# Patient Record
Sex: Female | Born: 1990 | Race: Black or African American | Hispanic: No | Marital: Single | Smoking: Former smoker
Health system: Southern US, Community
[De-identification: ages and names within clinical notes are randomized; demographics above are authoritative.]

## PROBLEM LIST (undated history)

## (undated) ENCOUNTER — Inpatient Hospital Stay (HOSPITAL_COMMUNITY): Payer: Self-pay

## (undated) HISTORY — PX: NO PAST SURGERIES: SHX2092

---

## 2014-08-11 ENCOUNTER — Emergency Department (HOSPITAL_COMMUNITY)
Admission: EM | Admit: 2014-08-11 | Discharge: 2014-08-11 | Disposition: A | Discharge status: Home / Self Care | Payer: Self-pay | Attending: Emergency Medicine | Admitting: Emergency Medicine

## 2014-08-11 ENCOUNTER — Encounter (HOSPITAL_COMMUNITY): Payer: Self-pay | Admitting: *Deleted

## 2014-08-11 ENCOUNTER — Emergency Department (HOSPITAL_COMMUNITY): Payer: Self-pay

## 2014-08-11 DIAGNOSIS — M791 Myalgia: Secondary | ICD-10-CM | POA: Insufficient documentation

## 2014-08-11 DIAGNOSIS — R059 Cough, unspecified: Secondary | ICD-10-CM

## 2014-08-11 DIAGNOSIS — R05 Cough: Secondary | ICD-10-CM

## 2014-08-11 DIAGNOSIS — J069 Acute upper respiratory infection, unspecified: Secondary | ICD-10-CM | POA: Insufficient documentation

## 2014-08-11 LAB — COMPREHENSIVE METABOLIC PANEL
ALK PHOS: 65 U/L (ref 39–117)
ALT: 28 U/L (ref 0–35)
AST: 30 U/L (ref 0–37)
Albumin: 3.3 g/dL — ABNORMAL LOW (ref 3.5–5.2)
Anion gap: 17 — ABNORMAL HIGH (ref 5–15)
BUN: 9 mg/dL (ref 6–23)
CO2: 17 mmol/L — AB (ref 19–32)
Calcium: 8.8 mg/dL (ref 8.4–10.5)
Chloride: 100 mEq/L (ref 96–112)
Creatinine, Ser: 1.06 mg/dL (ref 0.50–1.10)
GFR calc non Af Amer: 73 mL/min — ABNORMAL LOW (ref >90)
GFR, EST AFRICAN AMERICAN: 85 mL/min — AB (ref >90)
Glucose, Bld: 93 mg/dL (ref 70–99)
Potassium: 3.3 mmol/L — ABNORMAL LOW (ref 3.5–5.1)
Sodium: 134 mmol/L — ABNORMAL LOW (ref 135–145)
Total Bilirubin: 0.7 mg/dL (ref 0.3–1.2)
Total Protein: 7.3 g/dL (ref 6.0–8.3)

## 2014-08-11 LAB — CBC WITH DIFFERENTIAL/PLATELET
BASOS PCT: 0 % (ref 0–1)
Basophils Absolute: 0 10*3/uL (ref 0.0–0.1)
EOS ABS: 0 10*3/uL (ref 0.0–0.7)
Eosinophils Relative: 0 % (ref 0–5)
HCT: 33.5 % — ABNORMAL LOW (ref 36.0–46.0)
Hemoglobin: 11.5 g/dL — ABNORMAL LOW (ref 12.0–15.0)
Lymphocytes Relative: 13 % (ref 12–46)
Lymphs Abs: 1.5 10*3/uL (ref 0.7–4.0)
MCH: 28 pg (ref 26.0–34.0)
MCHC: 34.3 g/dL (ref 30.0–36.0)
MCV: 81.7 fL (ref 78.0–100.0)
MONOS PCT: 15 % — AB (ref 3–12)
Monocytes Absolute: 1.6 10*3/uL — ABNORMAL HIGH (ref 0.1–1.0)
NEUTROS PCT: 72 % (ref 43–77)
Neutro Abs: 7.9 10*3/uL — ABNORMAL HIGH (ref 1.7–7.7)
PLATELETS: 219 10*3/uL (ref 150–400)
RBC: 4.1 MIL/uL (ref 3.87–5.11)
RDW: 14.4 % (ref 11.5–15.5)
WBC: 11 10*3/uL — ABNORMAL HIGH (ref 4.0–10.5)

## 2014-08-11 LAB — I-STAT CG4 LACTIC ACID, ED: LACTIC ACID, VENOUS: 0.8 mmol/L (ref 0.5–2.2)

## 2014-08-11 MED ORDER — IBUPROFEN 400 MG PO TABS
600.0000 mg | ORAL_TABLET | Freq: Once | ORAL | Status: AC
  Administered 2014-08-11: 600 mg via ORAL
  Filled 2014-08-11 (×2): qty 1

## 2014-08-11 MED ORDER — ACETAMINOPHEN 325 MG PO TABS
650.0000 mg | ORAL_TABLET | Freq: Four times a day (QID) | ORAL | Status: DC | PRN
  Administered 2014-08-11: 650 mg via ORAL
  Filled 2014-08-11: qty 2

## 2014-08-11 MED ORDER — IBUPROFEN 600 MG PO TABS: 600.0000 mg | ORAL_TABLET | Freq: Four times a day (QID) | ORAL | Status: DC | PRN

## 2014-08-11 NOTE — ED Notes (Signed)
Pt in c/o cough and congestion, with body aches and chills since Monday, no distress noted

## 2014-08-11 NOTE — Discharge Instructions (Signed)
Upper Respiratory Infection, Adult An upper respiratory infection (URI) is also known as the common cold. It is often caused by a type of germ (virus). Colds are easily spread (contagious). You can pass it to others by kissing, coughing, sneezing, or drinking out of the same glass. Usually, you get better in 1 or 2 weeks.  HOME CARE   Only take medicine as told by your doctor.  Use a warm mist humidifier or breathe in steam from a hot shower.  Drink enough water and fluids to keep your pee (urine) clear or pale yellow.  Get plenty of rest.  Return to work when your temperature is back to normal or as told by your doctor. You may use a face mask and wash your hands to stop your cold from spreading. GET HELP RIGHT AWAY IF:   After the first few days, you feel you are getting worse.  You have questions about your medicine.  You have chills, shortness of breath, or brown or red spit (mucus).  You have yellow or brown snot (nasal discharge) or pain in the face, especially when you bend forward.  You have a fever, puffy (swollen) neck, pain when you swallow, or white spots in the back of your throat.  You have a bad headache, ear pain, sinus pain, or chest pain.  You have a high-pitched whistling sound when you breathe in and out (wheezing).  You have a lasting cough or cough up blood.  You have sore muscles or a stiff neck. MAKE SURE YOU:   Understand these instructions.  Will watch your condition.  Will get help right away if you are not doing well or get worse. Document Released: 01/01/2008 Document Revised: 10/07/2011 Document Reviewed: 10/20/2013 ExitCare Patient Information 2015 [employer]. This information is not intended to replace advice given to you by your health care provider. Make sure you discuss any questions you have with your health care provider.  

## 2014-08-11 NOTE — ED Provider Notes (Signed)
CSN: [id number]     Arrival date & time 08/11/14  1847 History   First MD Initiated Contact with Patient 08/11/14 2140     Chief Complaint  Patient presents with  . Cough     (Consider location/radiation/quality/duration/timing/severity/associated sxs/prior Treatment) Patient is a 24 y.o. female presenting with cough. The history is provided by the patient. No language interpreter was used.  Cough Cough characteristics:  Non-productive Severity:  Moderate Onset quality:  Gradual Duration:  3 days Timing:  Constant Progression:  Resolved Chronicity:  New Smoker: no   Context: not animal exposure, not occupational exposure, not sick contacts and not upper respiratory infection   Relieved by:  Nothing Worsened by:  Nothing tried Ineffective treatments:  None tried Associated symptoms: chills, fever and myalgias   Associated symptoms: no chest pain, no ear pain, no rash, no rhinorrhea, no shortness of breath and no sinus congestion   Fever:    Timing:  Constant   Temp source:  Subjective Myalgias:    Location:  Generalized   Quality:  Aching   Severity:  Moderate   Onset quality:  Gradual   Timing:  Constant   Progression:  Unchanged   History reviewed. No pertinent past medical history. History reviewed. No pertinent past surgical history. History reviewed. No pertinent family history. History  Substance Use Topics  . Smoking status: Never Smoker   . Smokeless tobacco: Not on file  . Alcohol Use: Not on file   OB History    No data available     Review of Systems  Constitutional: Positive for fever and chills.  HENT: Negative for ear pain and rhinorrhea.   Respiratory: Positive for cough. Negative for shortness of breath.   Cardiovascular: Negative for chest pain.  Musculoskeletal: Positive for myalgias.  Skin: Negative for rash.  All other systems reviewed and are negative.     Allergies  Review of patient's allergies indicates no known allergies.  Home  Medications   Prior to Admission medications   Medication Sig Start Date End Date Taking? Authorizing Provider  acetaminophen (TYLENOL) 325 MG tablet Take 650 mg by mouth every 6 (six) hours as needed for moderate pain.   Yes Historical Provider, MD  ibuprofen (ADVIL,MOTRIN) 600 MG tablet Take 1 tablet (600 mg total) by mouth every 6 (six) hours as needed for fever. 08/11/14   Bethann BerkshireAaron Jorian [place], MD   BP 117/67 mmHg  Pulse 79  Temp(Src) 98.3 F (36.8 C) (Oral)  Resp 20  SpO2 100%  LMP 08/04/2014 Physical Exam  Constitutional: She is oriented to person, place, and time. She appears well-developed and well-nourished. No distress.  HENT:  Head: Normocephalic and atraumatic.  Eyes: Pupils are equal, round, and reactive to light.  Neck: Normal range of motion.  Cardiovascular: Normal rate, regular rhythm, normal heart sounds and intact distal pulses.   Pulmonary/Chest: Effort normal. No respiratory distress. She has no wheezes. She exhibits no tenderness.  Abdominal: Soft. Bowel sounds are normal. She exhibits no distension. There is no tenderness. There is no rebound and no guarding.  Neurological: She is alert and oriented to person, place, and time. She has normal strength. No cranial nerve deficit or sensory deficit. She exhibits normal muscle tone. Coordination and gait normal.  Skin: Skin is warm and dry.  Nursing note and vitals reviewed.   ED Course  Procedures (including critical care time) Labs Review Labs Reviewed  CBC WITH DIFFERENTIAL - Abnormal; Notable for the following:    WBC 11.0 (*)  Hemoglobin 11.5 (*)    HCT 33.5 (*)    Neutro Abs 7.9 (*)    Monocytes Relative 15 (*)    Monocytes Absolute 1.6 (*)    All other components within normal limits  COMPREHENSIVE METABOLIC PANEL - Abnormal; Notable for the following:    Sodium 134 (*)    Potassium 3.3 (*)    CO2 17 (*)    Albumin 3.3 (*)    GFR calc non Af Amer 73 (*)    GFR calc Af Amer 85 (*)    Anion gap 17 (*)     All other components within normal limits  I-STAT CG4 LACTIC ACID, ED    Imaging Review Dg Chest 2 View  08/11/2014   CLINICAL DATA:  Cough, congestion, fever, and body aches 3 days.  EXAM: CHEST  2 VIEW  COMPARISON:  None.  FINDINGS: The heart size and mediastinal contours are within normal limits. Both lungs are clear. The visualized skeletal structures are unremarkable.  IMPRESSION: No active cardiopulmonary disease.   Electronically Signed   By: Rosalie Gums M.D.   On: 08/11/2014 20:53     EKG Interpretation None      MDM   Final diagnoses:  URI (upper respiratory infection)   24 y/o F with no PMH here with myalgias, chills, fevers, and cough for the past week.  Cough has been resolved for the past two days now and has been replaced with fevers and chills.  No dysuria, frequency, or urgency.  Doubt UTI or pyelonephritis.  No spine pain, no history of IVDA doubt epidural abscess.  No murmurs doubt endocarditis.  Patient suffering from a URI with a flu like illness.  With no history of asthma does not require treatment for flu.  Will treat symptomatically.  Patient treated with motrin and provided with a prescription for the same.  Patient discharged in a good condition with instructions to return to the ED with shortness of breath, or any other concerns.  Patient expressed understanding.  Care was discussed with my attending Dr. Preston Fleeting.     Bethann Berkshire, MD 08/11/14 2256  Dione Booze, MD 08/11/14 [phone]

## 2014-08-11 NOTE — ED Notes (Signed)
MD at bedside. 

## 2014-08-11 NOTE — ED Provider Notes (Signed)
24 year old female comes in with five-day history of cough, fever, chills, body aches. Fever was subjective. Cough is actually resolved. She came in because of ongoing chills and sweats. She did not receive an influenza vaccination this season. On exam, she is afebrile. Lungs are clear. Heart has regular rate and rhythm. Workup in the ED is unremarkable. She has a flulike illness and is advised to treated symptomatically.  I saw and evaluated the patient, reviewed the resident's note and I agree with the findings and plan.    Dione Boozeavid Aitana Burry, MD 08/11/14 2228

## 2016-07-15 LAB — OB RESULTS CONSOLE HIV ANTIBODY (ROUTINE TESTING): HIV: NONREACTIVE

## 2016-07-15 LAB — OB RESULTS CONSOLE GC/CHLAMYDIA
CHLAMYDIA, DNA PROBE: NEGATIVE
GC PROBE AMP, GENITAL: NEGATIVE

## 2016-07-15 LAB — OB RESULTS CONSOLE RPR: RPR: NONREACTIVE

## 2016-07-15 LAB — OB RESULTS CONSOLE HEPATITIS B SURFACE ANTIGEN: Hepatitis B Surface Ag: NEGATIVE

## 2016-07-15 LAB — OB RESULTS CONSOLE VARICELLA ZOSTER ANTIBODY, IGG: VARICELLA IGG: NON-IMMUNE/NOT IMMUNE

## 2016-07-15 LAB — OB RESULTS CONSOLE RUBELLA ANTIBODY, IGM: Rubella: IMMUNE

## 2016-07-29 NOTE — L&D Delivery Note (Cosign Needed)
Patient is 26 y.o. G3P1011 661w0d admitted for SOL.    Delivery Note At 2:07 AM a viable female was delivered via Vaginal, Spontaneous Delivery (Presentation: Occiput anterior ).  APGAR: 8, 9; weight pending.   Cord: 3 vessel. Cord avulsion occurred during delivery of placenta. Placenta was easily extracted with all membranes intact.   Anesthesia:  None  Lacerations: None; small left periurethral tear without bleeding  Est. Blood Loss (mL): 50  Mom to postpartum.  Baby to Couplet care / Skin to Skin.  De HollingsheadCatherine L Wallace 09/16/2016, 2:24 AM

## 2016-09-15 ENCOUNTER — Inpatient Hospital Stay (HOSPITAL_COMMUNITY)
Admission: AD | Admit: 2016-09-15 | Discharge: 2016-09-17 | DRG: 775 | Disposition: A | Discharge status: Home / Self Care | Payer: Medicaid Other | Source: Ambulatory Visit | Attending: Obstetrics & Gynecology | Admitting: Obstetrics & Gynecology

## 2016-09-15 ENCOUNTER — Encounter (HOSPITAL_COMMUNITY): Payer: Self-pay

## 2016-09-15 DIAGNOSIS — Z141 Cystic fibrosis carrier: Secondary | ICD-10-CM

## 2016-09-15 DIAGNOSIS — Z3A38 38 weeks gestation of pregnancy: Secondary | ICD-10-CM

## 2016-09-15 DIAGNOSIS — O99324 Drug use complicating childbirth: Principal | ICD-10-CM | POA: Diagnosis present

## 2016-09-15 DIAGNOSIS — F1290 Cannabis use, unspecified, uncomplicated: Secondary | ICD-10-CM | POA: Diagnosis present

## 2016-09-16 ENCOUNTER — Encounter (HOSPITAL_COMMUNITY): Payer: Self-pay | Admitting: Anesthesiology

## 2016-09-16 ENCOUNTER — Encounter (HOSPITAL_COMMUNITY): Payer: Self-pay | Admitting: Certified Nurse Midwife

## 2016-09-16 DIAGNOSIS — Z141 Cystic fibrosis carrier: Secondary | ICD-10-CM | POA: Diagnosis not present

## 2016-09-16 DIAGNOSIS — F1220 Cannabis dependence, uncomplicated: Secondary | ICD-10-CM | POA: Diagnosis not present

## 2016-09-16 DIAGNOSIS — O99324 Drug use complicating childbirth: Secondary | ICD-10-CM | POA: Diagnosis not present

## 2016-09-16 DIAGNOSIS — F1290 Cannabis use, unspecified, uncomplicated: Secondary | ICD-10-CM | POA: Diagnosis present

## 2016-09-16 DIAGNOSIS — Z3A38 38 weeks gestation of pregnancy: Secondary | ICD-10-CM

## 2016-09-16 DIAGNOSIS — F1210 Cannabis abuse, uncomplicated: Secondary | ICD-10-CM | POA: Diagnosis not present

## 2016-09-16 DIAGNOSIS — Z3493 Encounter for supervision of normal pregnancy, unspecified, third trimester: Secondary | ICD-10-CM | POA: Diagnosis present

## 2016-09-16 LAB — CBC
HEMATOCRIT: 32.9 % — AB (ref 36.0–46.0)
HEMOGLOBIN: 11.5 g/dL — AB (ref 12.0–15.0)
MCH: 27.1 pg (ref 26.0–34.0)
MCHC: 35 g/dL (ref 30.0–36.0)
MCV: 77.4 fL — AB (ref 78.0–100.0)
Platelets: 145 10*3/uL — ABNORMAL LOW (ref 150–400)
RBC: 4.25 MIL/uL (ref 3.87–5.11)
RDW: 14.5 % (ref 11.5–15.5)
WBC: 10 10*3/uL (ref 4.0–10.5)

## 2016-09-16 LAB — TYPE AND SCREEN
ABO/RH(D): O POS
Antibody Screen: NEGATIVE

## 2016-09-16 LAB — RPR: RPR: NONREACTIVE

## 2016-09-16 LAB — RAPID URINE DRUG SCREEN, HOSP PERFORMED
Amphetamines: NOT DETECTED
Barbiturates: NOT DETECTED
Benzodiazepines: NOT DETECTED
COCAINE: NOT DETECTED
OPIATES: NOT DETECTED
TETRAHYDROCANNABINOL: POSITIVE — AB

## 2016-09-16 LAB — ABO/RH: ABO/RH(D): O POS

## 2016-09-16 MED ORDER — LACTATED RINGERS IV SOLN
500.0000 mL | INTRAVENOUS | Status: DC | PRN
  Administered 2016-09-16: 500 mL via INTRAVENOUS

## 2016-09-16 MED ORDER — WITCH HAZEL-GLYCERIN EX PADS: 1.0000 "application " | MEDICATED_PAD | CUTANEOUS | Status: DC | PRN

## 2016-09-16 MED ORDER — ONDANSETRON HCL 4 MG/2ML IJ SOLN: 4.0000 mg | Freq: Four times a day (QID) | INTRAMUSCULAR | Status: DC | PRN

## 2016-09-16 MED ORDER — TETANUS-DIPHTH-ACELL PERTUSSIS 5-2.5-18.5 LF-MCG/0.5 IM SUSP: 0.5000 mL | Freq: Once | INTRAMUSCULAR | Status: DC

## 2016-09-16 MED ORDER — SENNOSIDES-DOCUSATE SODIUM 8.6-50 MG PO TABS
2.0000 | ORAL_TABLET | ORAL | Status: DC
  Administered 2016-09-16: 2 via ORAL
  Filled 2016-09-16: qty 2

## 2016-09-16 MED ORDER — FENTANYL CITRATE (PF) 100 MCG/2ML IJ SOLN: 50.0000 ug | INTRAMUSCULAR | Status: DC | PRN

## 2016-09-16 MED ORDER — OXYTOCIN BOLUS FROM INFUSION
500.0000 mL | Freq: Once | INTRAVENOUS | Status: AC
  Administered 2016-09-16: 500 mL via INTRAVENOUS

## 2016-09-16 MED ORDER — COCONUT OIL OIL: 1.0000 "application " | TOPICAL_OIL | Status: DC | PRN

## 2016-09-16 MED ORDER — ONDANSETRON HCL 4 MG PO TABS: 4.0000 mg | ORAL_TABLET | ORAL | Status: DC | PRN

## 2016-09-16 MED ORDER — PRENATAL MULTIVITAMIN CH
1.0000 | ORAL_TABLET | Freq: Every day | ORAL | Status: DC
  Administered 2016-09-16 – 2016-09-17 (×2): 1 via ORAL
  Filled 2016-09-16 (×2): qty 1

## 2016-09-16 MED ORDER — DIPHENHYDRAMINE HCL 25 MG PO CAPS: 25.0000 mg | ORAL_CAPSULE | Freq: Four times a day (QID) | ORAL | Status: DC | PRN

## 2016-09-16 MED ORDER — OXYCODONE HCL 5 MG PO TABS: 5.0000 mg | ORAL_TABLET | ORAL | Status: DC | PRN

## 2016-09-16 MED ORDER — ACETAMINOPHEN 325 MG PO TABS: 650.0000 mg | ORAL_TABLET | ORAL | Status: DC | PRN

## 2016-09-16 MED ORDER — OXYTOCIN 40 UNITS IN LACTATED RINGERS INFUSION - SIMPLE MED
2.5000 [IU]/h | INTRAVENOUS | Status: DC
  Filled 2016-09-16: qty 1000

## 2016-09-16 MED ORDER — SOD CITRATE-CITRIC ACID 500-334 MG/5ML PO SOLN: 30.0000 mL | ORAL | Status: DC | PRN

## 2016-09-16 MED ORDER — LIDOCAINE HCL (PF) 1 % IJ SOLN
30.0000 mL | INTRAMUSCULAR | Status: DC | PRN
  Filled 2016-09-16: qty 30

## 2016-09-16 MED ORDER — DIBUCAINE 1 % RE OINT: 1.0000 "application " | TOPICAL_OINTMENT | RECTAL | Status: DC | PRN

## 2016-09-16 MED ORDER — SIMETHICONE 80 MG PO CHEW: 80.0000 mg | CHEWABLE_TABLET | ORAL | Status: DC | PRN

## 2016-09-16 MED ORDER — OXYCODONE-ACETAMINOPHEN 5-325 MG PO TABS: 1.0000 | ORAL_TABLET | ORAL | Status: DC | PRN

## 2016-09-16 MED ORDER — OXYCODONE HCL 5 MG PO TABS: 10.0000 mg | ORAL_TABLET | ORAL | Status: DC | PRN

## 2016-09-16 MED ORDER — BENZOCAINE-MENTHOL 20-0.5 % EX AERO: 1.0000 "application " | INHALATION_SPRAY | CUTANEOUS | Status: DC | PRN

## 2016-09-16 MED ORDER — ONDANSETRON HCL 4 MG/2ML IJ SOLN: 4.0000 mg | INTRAMUSCULAR | Status: DC | PRN

## 2016-09-16 MED ORDER — IBUPROFEN 600 MG PO TABS
600.0000 mg | ORAL_TABLET | Freq: Four times a day (QID) | ORAL | Status: DC
  Administered 2016-09-16 – 2016-09-17 (×6): 600 mg via ORAL
  Filled 2016-09-16 (×6): qty 1

## 2016-09-16 MED ORDER — FLEET ENEMA 7-19 GM/118ML RE ENEM: 1.0000 | ENEMA | RECTAL | Status: DC | PRN

## 2016-09-16 MED ORDER — LACTATED RINGERS IV SOLN
INTRAVENOUS | Status: DC
  Administered 2016-09-16: 02:00:00 via INTRAVENOUS

## 2016-09-16 MED ORDER — OXYCODONE-ACETAMINOPHEN 5-325 MG PO TABS: 2.0000 | ORAL_TABLET | ORAL | Status: DC | PRN

## 2016-09-16 MED ORDER — ZOLPIDEM TARTRATE 5 MG PO TABS: 5.0000 mg | ORAL_TABLET | Freq: Every evening | ORAL | Status: DC | PRN

## 2016-09-16 NOTE — H&P (Signed)
**Note Summer-Identified via Obfuscation** LABOR ADMISSION HISTORY AND PHYSICAL  Summer Barnes is a 26 y.o. female G3P1011 with IUP at [redacted]w[redacted]d by LMP presenting for SOL. She reports +FMs, No LOF, no VB, no blurry vision, headaches or peripheral edema, and RUQ pain.  She plans on breast feeding. She request IUD for birth control.  Dating: By LMP --->  Estimated Date of Delivery: 09/30/16  Prenatal History/Complications: THC use during pregnancy  Late and Limited PNC (Established at 29w, only two PNC visits)  Cystic Fibrosis Carrier   Past Medical History: History reviewed. No pertinent past medical history.  Past Surgical History: History reviewed. No pertinent surgical history.  Obstetrical History: OB History    Gravida Para Term Preterm AB Living   3 1 1   1 1    SAB TAB Ectopic Multiple Live Births   1       1      Social History: Social History   Social History  . Marital status: Single    Spouse name: N/A  . Number of children: N/A  . Years of education: N/A   Social History Main Topics  . Smoking status: Never Smoker  . Smokeless tobacco: None  . Alcohol use None  . Drug use: Unknown  . Sexual activity: Not Asked   Other Topics Concern  . None   Social History Narrative  . None    Family History: History reviewed. No pertinent family history.  Allergies: No Known Allergies  Prescriptions Prior to Admission  Medication Sig Dispense Refill Last Dose  . Prenatal Vit-Fe Fumarate-FA (PRENATAL MULTIVITAMIN) TABS tablet Take 1 tablet by mouth daily at 12 noon.     Marland Kitchen acetaminophen (TYLENOL) 325 MG tablet Take 650 mg by mouth every 6 (six) hours as needed for moderate pain.   08/11/2014 at Unknown time  . ibuprofen (ADVIL,MOTRIN) 600 MG tablet Take 1 tablet (600 mg total) by mouth every 6 (six) hours as needed for fever. 30 tablet 0      Review of Systems   All systems reviewed and negative except as stated in HPI  Blood pressure 121/74, pulse 98, temperature 98.5 F (36.9 C), temperature source  Oral, resp. rate 16, height 5\' 5"  (1.651 m), weight 83.9 kg (185 lb). General appearance: alert, cooperative and no distress Lungs: clear to auscultation bilaterally Heart: regular rate and rhythm Abdomen: soft, non-tender; bowel sounds normal Extremities: Homans sign is negative, no sign of DVT Presentation: cephalic Fetal monitoringBaseline: 145 bpm, Variability: Good {> 6 bpm), Accelerations: Reactive and Decelerations: Absent Uterine activity Irregular Dilation: 6 Effacement (%): 90 Station: -2 Exam by:: Lanice Shirts RN    Prenatal labs: ABO, Rh:  O+ Antibody:  Neg Rubella: Immune RPR:   Negative HBsAg:   Negative  HIV:   Non-reactive GBS:   Unknown  1 hr Glucola 125 Genetic screening  Too late  Anatomy US Normal   Prenatal Transfer Tool  Maternal Diabetes: No Genetic Screening: Declined; established care too late  Maternal Ultrasounds/Referrals: Normal Fetal Ultrasounds or other Referrals:  None Maternal Substance Abuse:  Yes:  Type: Marijuana Significant Maternal Medications:  None Significant Maternal Lab Results: None  No results found for this or any previous visit (from the past 24 hour(s)).  There are no active problems to display for this patient.   Assessment/Plan: Summer Barnes is a 26 y.o. G3P1011 at [redacted]w[redacted]d here for SOL.   #Labor:Expectant management #Pain: Declines epidural.  #FWB: Cat I  #ID:  GBS unknown. Will obtain rapid GBS. No indication  for prophylaxis at present.  #MOF: Breast.  #MOC: IUD.  #Circ: N/A  #Substance Abuse during Pregnancy: UDS   Summer Barnes 09/16/2016, 12:53 AM

## 2016-09-16 NOTE — Lactation Note (Signed)
This note was copied from a baby's chart. Lactation Consultation Note Experienced BF mom BF her now 26 yr old for 4 months. Mom has everted nipples. Hand expression taught, expressed 15 ml colostrum. Supplementing sheet given and reviewed. Explained to mom BF first, then supplement. Supplement w/colostrum first, then if needed formula. Educated on colostrum storage. Mom encouraged to feed baby 8-12 times/24 hours and with feeding cues.  Mom shown how to use DEBP & how to disassemble, clean, & reassemble parts. Mom knows to pump q3h for 15-20 min. Educated newborn behavior, feeding behavior, supply and demand, STS, I&O.  WH/LC brochure given w/resources, support groups and LC services. Patient Name: Summer Alvina FilbertSomarlia Rauch ZOXWR'UToday's Date: 09/16/2016 Reason for consult: Initial assessment;Infant < 6lbs   Maternal Data Has patient been taught Hand Expression?: Yes Does the patient have breastfeeding experience prior to this delivery?: Yes  Feeding Feeding Type: Breast Fed Length of feed: 20 min  LATCH Score/Interventions Latch: Repeated attempts needed to sustain latch, nipple held in mouth throughout feeding, stimulation needed to elicit sucking reflex. (per MOB) Intervention(s): Adjust position  Audible Swallowing: A few with stimulation (per MOB) Intervention(s): Skin to skin;Hand expression  Type of Nipple: Everted at rest and after stimulation  Comfort (Breast/Nipple): Soft / non-tender     Hold (Positioning): Assistance needed to correctly position infant at breast and maintain latch. Intervention(s): Breastfeeding basics reviewed;Support Pillows;Position options;Skin to skin  LATCH Score: 7  Lactation Tools Discussed/Used Tools: Pump Breast pump type: Double-Electric Breast Pump WIC Program: Yes Pump Review: Setup, frequency, and cleaning;Milk Storage Initiated by:: Peri JeffersonL. Deshara Rossi RN IBCLC Date initiated:: 09/16/16   Consult Status Consult Status: Follow-up Date:  09/17/16 Follow-up type: In-patient    Delania Ferg, Diamond NickelLAURA G 09/16/2016, 7:07 AM

## 2016-09-16 NOTE — MAU Note (Signed)
Patient presents to MAU with complaints of contractions beginning at 2000 on 09/15/16. Denies LOF and bleeding. +FM

## 2016-09-16 NOTE — MAU Note (Signed)
Urine sent to lab 

## 2016-09-17 ENCOUNTER — Encounter (HOSPITAL_COMMUNITY): Payer: Self-pay

## 2016-09-17 ENCOUNTER — Ambulatory Visit: Payer: Self-pay

## 2016-09-17 DIAGNOSIS — F1210 Cannabis abuse, uncomplicated: Secondary | ICD-10-CM

## 2016-09-17 MED ORDER — IBUPROFEN 600 MG PO TABS: 600.0000 mg | ORAL_TABLET | Freq: Four times a day (QID) | ORAL | 0 refills | Status: DC

## 2016-09-17 NOTE — Lactation Note (Signed)
This note was copied from a baby's chart. Lactation Consultation Note  Patient Name: [name] ZOXWR'UToday's Date: 09/17/2016 Reason for consult: Follow-up assessment  Baby 40 hours old. Parents giving baby a bottle of Alimentum. Gave parents the formula guidelines and assisted with paced feeding. Parents given regular Similac Pro-Advanced to use from here forward since not putting baby to breast.   Maternal Data    Feeding Feeding Type: Formula  LATCH Score/Interventions                      Lactation Tools Discussed/Used     Consult Status Consult Status: PRN    Sherlyn HayJennifer D Quindarrius [place] 09/17/2016, 6:48 PM

## 2016-09-17 NOTE — Discharge Summary (Signed)
Obstetric Discharge Summary Reason for Admission: ROL and onset of labor at 38 weeks Prenatal Procedures: ultrasound Intrapartum Procedures: spontaneous vaginal delivery Postpartum Procedures: none Complications-Operative and Postpartum: none Hemoglobin  Date Value Ref Range Status  09/16/2016 11.5 (L) 12.0 - 15.0 g/dL Final   HCT  Date Value Ref Range Status  09/16/2016 32.9 (L) 36.0 - 46.0 % Final    Physical Exam:  General: alert Lochia: appropriate Uterine Fundus: firm DVT Evaluation: No evidence of DVT seen on physical exam.  Discharge Diagnoses: Term Pregnancy-delivered  Discharge Information: Date: 09/17/2016 Activity: pelvic rest Diet: routine Medications: PNV and Ibuprofen Condition: stable Instructions: refer to practice specific booklet Discharge to: home  She plans IUD at pp visit. [facility] Schedule an appointment as soon as possible for a visit in 6 week(s).   Why:  Tell them you want an IUD Contact information: [address] [place] [id number] [phone]           Newborn Data: Live born female  Birth Weight: 4 lb 14.6 oz (2228 g) APGAR: 8, 9  Home with mother.  Allie BossierMyra C Nickson [place] 09/17/2016, 7:55 AM

## 2016-09-17 NOTE — Discharge Instructions (Signed)

## 2016-09-17 NOTE — Lactation Note (Signed)
This note was copied from a baby's chart. Lactation Consultation Note Baby w/3% weight loss weighing 4.12 lbs. Mom is giving formula. Has given some colostrum. Baby not to interested in feeding at this time. Baby chews and tongue thrust nipple. Suckles occasionally then chews. Mom states she has a hard time getting baby to take formula. Mom states baby will take BM better.  Reviewed amount baby needs to be eating. Mom putting to breast occasionally. Baby has a tiny mouth, mom has med. Size nipples. while in rm. Mom attempted to latch baby. Baby wasn't interested.  Asked mom if she had been pumping, mom stated she did a couple of times but nothing came out. Reminded mom of the consistency of colostrum, explained breast needs to be stimulated d/t baby SGA, will not stimulate breast enough.  Reviewed how to use pump, applied to Lt. Breast, colostrum beading up to nipple right away. Mom excited.  Hand expressed 14 ml colostrum. Mom hand expressed 3 ml. Mom was giving to baby as LC left rm.  Encouraged STS, I&O, and let RN know if baby has no interest in feedings.  Report to RN of consult.  Patient Name: Summer Alvina FilbertSomarlia Barnes ZOXWR'UToday's Date: 09/17/2016 Reason for consult: Follow-up assessment;Infant < 6lbs   Maternal Data    Feeding Feeding Type: Breast Milk Nipple Type: Slow - flow Length of feed: 0 min  LATCH Score/Interventions    Intervention(s): Hand expression;Alternate breast massage  Type of Nipple: Everted at rest and after stimulation  Comfort (Breast/Nipple): Soft / non-tender     Intervention(s): Breastfeeding basics reviewed;Support Pillows;Position options;Skin to skin     Lactation Tools Discussed/Used Tools: Pump Breast pump type: Double-Electric Breast Pump   Consult Status Consult Status: Follow-up Date: 09/17/16 (pm) Follow-up type: In-patient    Charyl DancerCARVER, Jaramie Bastos G 09/17/2016, 4:52 AM

## 2016-09-18 ENCOUNTER — Ambulatory Visit: Payer: Self-pay

## 2016-09-18 NOTE — Progress Notes (Signed)
CLINICAL SOCIAL WORK MATERNAL/CHILD NOTE  Patient Details  Name: Summer Barnes MRN: [id number] Date of Birth: 09/16/2016  Date:  09/18/2016  Clinical Social Worker Initiating Note:  Mayra Jolliffe, LCSW Date/ Time Initiated:  09/18/16/0845     Child's Name:  Summer Barnes   Legal Guardian:  Other (Comment) (Parents: Summer Barnes and Summer Barnes)   Need for Interpreter:  None   Date of Referral:  09/17/16     Reason for Referral:  Current Substance Use/Substance Use During Pregnancy    Referral Source:  Central Nursery   Address:  [address] [place],  [address]  Phone number:  [phone]   Household Members:  Significant Other, Minor Children (Couple has a 16 month old son named Summer Barnes)   Natural Supports (not living in the home):  Friends, Immediate Family, Extended Family   Professional Supports: None   Employment:     Type of Work: FOB works in remodeling and painting.   Education:      Financial Resources:  Medicaid   Other Resources:      Cultural/Religious Considerations Which May Impact Care: None stated.  Strengths:  Ability to meet basic needs , Pediatrician chosen , Home prepared for child  (TAPM)   Risk Factors/Current Problems:  Substance Use  (MOB positive for THC on admission.  Baby positive UDS for THC.  CDS pending.)   Cognitive State:  Able to Concentrate , Alert , Insightful , Linear Thinking    Mood/Affect:  Euthymic , Calm , Relaxed , Interested    CSW Assessment: CSW met with parents in MOB's first floor room/141 to offer support and complete assessment due to hx of marijuana use.  MOB was positive for THC on admission and baby's UDS is positive for THC also.  Parents were calm, pleasant and welcoming of CSW's visit.  MOB gave permission to speak openly with FOB present.  Both parents were easy to engage. MOB reports that labor was "quick" and was very happy about this.  She held baby as we  spoke and bonding is evident in the way she looked at her, spoke about her and tended to her.  She states she is "great" when asked how she is feeling emotionally and about having another baby.  Couple has a 16 month old son at home who is currently being cared for by FOB's parents.  Couple reports that they have a good support system.  FOB's family lives locally and MOB's family live in [place] and plan to visit when they get home from the hospital.  Parents state they have everything they need for baby at home and are aware of SIDS precautions. CSW provided education regarding PMADs and the importance of talking with a medical professional should concerns arise.  Parents were attentive to information being given and agreed.  MOB reports no hx of mental illness or concerns after her first child's birth. CSW inquired about marijuana use and informed parents of baby's positive screen.  Parents were appropriately concerned about this and understand hospital drug screen policy and mandated reporting to Child Protective Services.  MOB denies hx of CPS involvement.  She reports last use of marijuana was approximately 3-4 weeks ago.  She states she smoked occasionally due to nausea.  She reports no plans to smoke in the future.  FOB denies marijuana use.  MOB denies use of any other substances.   CSW will make report to [place] CPS and monitor CDS result.    CSW identifies no barriers to discharge as CPS can follow up in the home.   CSW Plan/Description:  Child Protective Service Report , Patient/Family Education , No Further Intervention Required/No Barriers to Discharge    Shaddai Shapley Elizabeth, LCSW 09/18/2016, 10:39 AM  

## 2016-09-18 NOTE — Lactation Note (Signed)
This note was copied from a baby's chart. Lactation Consultation Note: Infant is 7055 hours old and is at 4% weight loss.  Mother breastfeeding infant on cue.  Mother is supplementing using EBM.  Mother states she pumped about 15-20 ml .  Advised mother to continue to post pump. And to supplement infant .  Mother was given a harmony hand pump;  She doesn't have and electric pump. Mother is not active with WIC.  [facility]. Advised mother to page for next feeding to check latch. [facility] and community support as needed.   Patient Name: Summer Summer FilbertSomarlia Barnes WUJWJ'XToday's Date: 09/18/2016 Reason for consult: Follow-up assessment   Maternal Data    Feeding Feeding Type: Breast Fed Length of feed: 20 min  LATCH Score/Interventions Latch:  (instructed mom to call for next latch)                    Lactation Tools Discussed/Used     Consult Status Consult Status: Follow-up    Summer BornKendrick, Summer Barnes [facility] 09/18/2016, 9:59 AM

## 2016-09-19 ENCOUNTER — Ambulatory Visit: Payer: Self-pay

## 2016-09-19 NOTE — Lactation Note (Signed)
This note was copied from a baby's chart. Lactation Consultation Note;Mother paged for latch to be checked. Mother attempting to latch infant in cradle hold , leaning over infant without pillow support.  Mother taught to use good support with pillows . Infant was placed in cross cradle hold. Infant latched on with good depth. Advised mother how to hold breast and infants head at the same level. Infant had a good suck swallow pattern. Infant burped and was placed in football hold. Mother latched infant without any assistance. Infant fed for 10  Mins. Mother offering 30 ml of ebm with a bottle. Mother advised to use the electric pump again before going home.  Mother has hand pump for home use. Mothers breast are filling . Advised to do good massage.  Patient Name: Summer Barnes ZOXWR'UToday's Date: 09/19/2016 Reason for consult: Follow-up assessment   Maternal Data    Feeding Feeding Type: Breast Fed Length of feed: 10 min  LATCH Score/Interventions Latch: Grasps breast easily, tongue down, lips flanged, rhythmical sucking. Intervention(s): Skin to skin Intervention(s): Adjust position;Assist with latch;Breast compression  Audible Swallowing: Spontaneous and intermittent Intervention(s): Alternate breast massage  Type of Nipple: Everted at rest and after stimulation  Comfort (Breast/Nipple): Filling, red/small blisters or bruises, mild/mod discomfort  Problem noted: Filling Interventions (Filling): Firm support;Frequent nursing;Hand pump  Hold (Positioning): No assistance needed to correctly position infant at breast. Intervention(s): Support Pillows;Position options  LATCH Score: 9  Lactation Tools Discussed/Used     Consult Status Consult Status: Complete    Michel BickersKendrick, Tsion Inghram McCoy 09/19/2016, 11:25 AM

## 2016-09-19 NOTE — Lactation Note (Signed)
This note was copied from a baby's chart. Lactation Consultation Note: Mother is pumping 30-60 ml with each pumping. Mother states that infant is breastfeeding well. Mother reports that she is hearing infant swallow. Advised mother to page for next feeding to be observed. Reviewed collection and  storage on page [phone] in Mother and Baby book. Mother doesn't have an electric pump.  [facility] but mother states she will just use her hand pump. Mother was given another hand pump so she could pump both breast.  Mother states she will call me with next feeding.   Patient Name: Summer Barnes Reason for consult: Follow-up assessment   Maternal Data    Feeding Feeding Type: Breast Fed Length of feed: 10 min  LATCH Score/Interventions Latch: Grasps breast easily, tongue down, lips flanged, rhythmical sucking. Intervention(s): Skin to skin  Audible Swallowing: Spontaneous and intermittent  Type of Nipple: Everted at rest and after stimulation  Comfort (Breast/Nipple): Soft / non-tender           Lactation Tools Discussed/Used     Consult Status      Summer Barnes, Summer Barnes Barnes, 10:16 AM

## 2017-03-17 ENCOUNTER — Inpatient Hospital Stay (HOSPITAL_COMMUNITY)
Admission: AD | Admit: 2017-03-17 | Discharge: 2017-03-18 | Disposition: A | Discharge status: Home / Self Care | Payer: Medicaid Other | Source: Ambulatory Visit | Attending: Obstetrics and Gynecology | Admitting: Obstetrics and Gynecology

## 2017-03-17 DIAGNOSIS — Z3A16 16 weeks gestation of pregnancy: Secondary | ICD-10-CM | POA: Insufficient documentation

## 2017-03-17 DIAGNOSIS — O0932 Supervision of pregnancy with insufficient antenatal care, second trimester: Secondary | ICD-10-CM | POA: Insufficient documentation

## 2017-03-17 DIAGNOSIS — O99332 Smoking (tobacco) complicating pregnancy, second trimester: Secondary | ICD-10-CM | POA: Insufficient documentation

## 2017-03-17 DIAGNOSIS — O368120 Decreased fetal movements, second trimester, not applicable or unspecified: Secondary | ICD-10-CM

## 2017-03-18 ENCOUNTER — Encounter (HOSPITAL_COMMUNITY): Payer: Self-pay

## 2017-03-18 DIAGNOSIS — Z3A16 16 weeks gestation of pregnancy: Secondary | ICD-10-CM | POA: Diagnosis not present

## 2017-03-18 DIAGNOSIS — O368120 Decreased fetal movements, second trimester, not applicable or unspecified: Secondary | ICD-10-CM | POA: Diagnosis not present

## 2017-03-18 DIAGNOSIS — O99332 Smoking (tobacco) complicating pregnancy, second trimester: Secondary | ICD-10-CM | POA: Diagnosis not present

## 2017-03-18 DIAGNOSIS — O26892 Other specified pregnancy related conditions, second trimester: Secondary | ICD-10-CM | POA: Diagnosis present

## 2017-03-18 DIAGNOSIS — O0932 Supervision of pregnancy with insufficient antenatal care, second trimester: Secondary | ICD-10-CM | POA: Diagnosis not present

## 2017-03-18 LAB — POCT PREGNANCY, URINE: PREG TEST UR: POSITIVE — AB

## 2017-03-18 LAB — WET PREP, GENITAL
Sperm: NONE SEEN
TRICH WET PREP: NONE SEEN
YEAST WET PREP: NONE SEEN

## 2017-03-18 LAB — URINALYSIS, ROUTINE W REFLEX MICROSCOPIC
BILIRUBIN URINE: NEGATIVE
Glucose, UA: NEGATIVE mg/dL
Hgb urine dipstick: NEGATIVE
KETONES UR: NEGATIVE mg/dL
LEUKOCYTES UA: NEGATIVE
Nitrite: NEGATIVE
Protein, ur: 30 mg/dL — AB
Specific Gravity, Urine: 1.026 (ref 1.005–1.030)
pH: 6 (ref 5.0–8.0)

## 2017-03-18 LAB — GC/CHLAMYDIA PROBE AMP (~~LOC~~) NOT AT ARMC
Chlamydia: NEGATIVE
Neisseria Gonorrhea: NEGATIVE

## 2017-03-18 NOTE — MAU Provider Note (Signed)
Chief Complaint: Abdominal Pain   First Provider Initiated Contact with Patient 03/18/17 0200      SUBJECTIVE HPI: Summer Barnes is a 26 y.o. [id number] at 60w0dby LMP who presents to maternity admissions reporting she has not felt any fetal movement but she is far enough along she thinks to feel it. She also had abdominal cramping yesterday but none today. She has no tried any treatments. There are no other associated symptoms.  She had a term vaginal delivery on 09/16/16 and then had a period in March but none since. She had her first positive pregnancy test in May.   She denies vaginal bleeding, vaginal itching/burning, urinary symptoms, h/a, dizziness, n/v, or fever/chills.     HPI  No past medical history on file. No past surgical history on file. Social History   Social History  . Marital status: Single    Spouse name: N/A  . Number of children: N/A  . Years of education: N/A   Occupational History  . Not on file.   Social History Main Topics  . Smoking status: Current Some Day Smoker  . Smokeless tobacco: Never Used  . Alcohol use Not on file  . Drug use: Yes    Types: Marijuana  . Sexual activity: Yes   Other Topics Concern  . Not on file   Social History Narrative  . No narrative on file   No current facility-administered medications on file prior to encounter.    Current Outpatient Prescriptions on File Prior to Encounter  Medication Sig Dispense Refill  . Prenatal Vit-Fe Fumarate-FA (PRENATAL MULTIVITAMIN) TABS tablet Take 1 tablet by mouth daily at 12 noon.     No Known Allergies  ROS:  Review of Systems  Constitutional: Negative for chills, fatigue and fever.  Eyes: Negative for visual disturbance.  Respiratory: Negative for shortness of breath.   Cardiovascular: Negative for chest pain.  Gastrointestinal: Positive for abdominal pain. Negative for nausea and vomiting.  Genitourinary: Negative for difficulty urinating, dysuria, flank pain, pelvic pain,  vaginal bleeding, vaginal discharge and vaginal pain.  Neurological: Negative for dizziness and headaches.  Psychiatric/Behavioral: Negative.      I have reviewed patient's Past Medical Hx, Surgical Hx, Family Hx, Social Hx, medications and allergies.   Physical Exam   Patient Vitals for the past 24 hrs:  BP Temp Temp src Pulse Resp Height Weight  03/18/17 0217 124/78 - - 93 19 - -  03/18/17 0017 125/68 98.9 F (37.2 C) Oral 84 20 5' 5" (1.651 m) 172 lb 4 oz (78.1 kg)   Constitutional: Well-developed, well-nourished female in no acute distress.  Cardiovascular: normal rate Respiratory: normal effort GI: Abd soft, non-tender. Pos BS x 4 MS: Extremities nontender, no edema, normal ROM Neurologic: Alert and oriented x 4.  GU: Neg CVAT.  PELVIC EXAM:  GCC and wet prep collected by blind swab  Fundal height 15-16 cm  FHT 155 by doppler  LAB RESULTS Results for orders placed or performed during the hospital encounter of 03/17/17 (from the past 24 hour(s))  Urinalysis, Routine w reflex microscopic     Status: Abnormal   Collection Time: 03/18/17 12:21 AM  Result Value Ref Range   Color, Urine YELLOW YELLOW   APPearance HAZY (A) CLEAR   Specific Gravity, Urine 1.026 1.005 - 1.030   pH 6.0 5.0 - 8.0   Glucose, UA NEGATIVE NEGATIVE mg/dL   Hgb urine dipstick NEGATIVE NEGATIVE   Bilirubin Urine NEGATIVE NEGATIVE   Ketones, ur NEGATIVE  NEGATIVE mg/dL   Protein, ur 30 (A) NEGATIVE mg/dL   Nitrite NEGATIVE NEGATIVE   Leukocytes, UA NEGATIVE NEGATIVE   RBC / HPF 0-5 0 - 5 RBC/hpf   WBC, UA 0-5 0 - 5 WBC/hpf   Bacteria, UA RARE (A) NONE SEEN   Squamous Epithelial / LPF 6-30 (A) NONE SEEN   Mucus PRESENT   Pregnancy, urine POC     Status: Abnormal   Collection Time: 03/18/17 12:29 AM  Result Value Ref Range   Preg Test, Ur POSITIVE (A) NEGATIVE  Wet prep, genital     Status: Abnormal   Collection Time: 03/18/17  2:06 AM  Result Value Ref Range   Yeast Wet Prep HPF POC NONE  SEEN NONE SEEN   Trich, Wet Prep NONE SEEN NONE SEEN   Clue Cells Wet Prep HPF POC PRESENT (A) NONE SEEN   WBC, Wet Prep HPF POC FEW (A) NONE SEEN   Sperm NONE SEEN     --/--/O POS, O POS (02/19 0105)  IMAGING No results found.  MAU Management/MDM: Ordered labs and reviewed results.  With normal FHT and fundal height only 15-16 cm, likely no fetal movement is normal for this early gestation.  Pt plans to start care at [facility] but has not made appointment. She denies pain in MAU.  Pt reports she is reassured with FHT and with estimated due date today.  Clue cells present but pt without symptoms so criteria not met to treat BV. Pt to follow up at [facility] for prenatal care/ultrasound for confirmed dating.  Pt stable at time of discharge.  ASSESSMENT 1. Late prenatal care affecting pregnancy in second trimester   2. Decreased fetal movements in second trimester, single or unspecified fetus     PLAN Discharge home Allergies as of 03/18/2017   No Known Allergies     Medication List    STOP taking these medications   ibuprofen 600 MG tablet Commonly known as:  ADVIL,MOTRIN     TAKE these medications   prenatal multivitamin Tabs tablet Take 1 tablet by mouth daily at 12 noon.      Follow-up Information    Department, [facility]. Schedule an appointment as soon as possible for a visit.   Why:  Return to MAU as needed for emergencies Contact information: [address] [place] [address] [phone]           Fatima Blank Certified Nurse-Midwife 03/18/2017  3:03 AM

## 2017-03-18 NOTE — MAU Note (Addendum)
PT SAYS HER LMP WAS 3-8   AND  HPT- WAS POSITIVE.    NO DR.  NO BIRTH CONTROL. LAST SEX-  SAT.  HAD CRAMPING   AT 3PM.- STOPPED.        HAD VAG  D/C- BROWN - SAT .

## 2017-05-26 LAB — OB RESULTS CONSOLE VARICELLA ZOSTER ANTIBODY, IGG: Varicella: NON-IMMUNE/NOT IMMUNE

## 2017-05-26 LAB — OB RESULTS CONSOLE GC/CHLAMYDIA
CHLAMYDIA, DNA PROBE: NEGATIVE
Gonorrhea: NEGATIVE

## 2017-05-26 LAB — OB RESULTS CONSOLE RPR: RPR: NONREACTIVE

## 2017-05-26 LAB — OB RESULTS CONSOLE PLATELET COUNT: Platelets: 136

## 2017-05-26 LAB — OB RESULTS CONSOLE HGB/HCT, BLOOD
HCT: 33
HEMOGLOBIN: 10.8

## 2017-05-26 LAB — OB RESULTS CONSOLE HEPATITIS B SURFACE ANTIGEN: HEP B S AG: NEGATIVE

## 2017-05-26 LAB — OB RESULTS CONSOLE ANTIBODY SCREEN: Antibody Screen: NEGATIVE

## 2017-05-26 LAB — OB RESULTS CONSOLE RUBELLA ANTIBODY, IGM: RUBELLA: IMMUNE

## 2017-05-26 LAB — OB RESULTS CONSOLE ABO/RH: RH TYPE: POSITIVE

## 2017-06-05 ENCOUNTER — Encounter: Payer: Self-pay | Admitting: *Deleted

## 2017-06-12 ENCOUNTER — Encounter: Payer: Self-pay | Admitting: Obstetrics & Gynecology

## 2017-06-12 ENCOUNTER — Other Ambulatory Visit: Payer: Self-pay

## 2017-06-12 ENCOUNTER — Ambulatory Visit (INDEPENDENT_AMBULATORY_CARE_PROVIDER_SITE_OTHER): Payer: Medicaid Other | Admitting: Obstetrics & Gynecology

## 2017-06-12 DIAGNOSIS — O0990 Supervision of high risk pregnancy, unspecified, unspecified trimester: Secondary | ICD-10-CM | POA: Insufficient documentation

## 2017-06-12 DIAGNOSIS — O0993 Supervision of high risk pregnancy, unspecified, third trimester: Secondary | ICD-10-CM

## 2017-06-12 LAB — POCT URINALYSIS DIP (DEVICE)
Bilirubin Urine: NEGATIVE
GLUCOSE, UA: NEGATIVE mg/dL
Hgb urine dipstick: NEGATIVE
KETONES UR: NEGATIVE mg/dL
Nitrite: NEGATIVE
PROTEIN: NEGATIVE mg/dL
SPECIFIC GRAVITY, URINE: 1.01 (ref 1.005–1.030)
Urobilinogen, UA: 0.2 mg/dL (ref 0.0–1.0)
pH: 7 (ref 5.0–8.0)

## 2017-06-12 NOTE — Progress Notes (Signed)
  Subjective:transfer from HD    Summer Barnes is a [id number] 1317w2d being seen today for her first obstetrical visit.  Her obstetrical history is significant for previous SGA vs preterm delivery. Patient does intend to breast feed. Pregnancy history fully reviewed.  Patient reports no complaints.  Vitals:   06/12/17 1002  BP: 118/76  Pulse: 93  Weight: 183 lb 4.8 oz (83.1 kg)    HISTORY: OB History  Gravida Para Term Preterm AB Living  4 2 2   1 2   SAB TAB Ectopic Multiple Live Births  1     0 2    # Outcome Date GA Lbr Len/2nd Weight Sex Delivery Anes PTL Lv  4 Current           3 Term 09/16/16 2145w0d 06:05 / 00:02 4 lb 14.6 oz (2.228 kg) F Vag-Spont None  LIV  2 Term 05/16/15   8 lb (3.629 kg) M Vag-Spont None N LIV  1 SAB 2014             History reviewed. No pertinent past medical history. Past Surgical History:  Procedure Laterality Date  . NO PAST SURGERIES     Family History  Problem Relation Age of Onset  . Arthritis Maternal Grandmother   . Anemia Maternal Grandmother      Exam    Uterus:     Pelvic Exam:    Perineum: No Hemorrhoids   Vulva: normal   Vagina:  normal mucosa   pH:     Cervix: no lesions   Adnexa: not evaluated   Bony Pelvis: average  System:     Skin: normal coloration and turgor, no rashes    Neurologic: oriented, normal mood   Extremities: normal strength, tone, and muscle mass   HEENT PERRLA   Mouth/Teeth dental hygiene good   Neck supple   Cardiovascular: regular rate and rhythm   Respiratory:  appears well, vitals normal, no respiratory distress, acyanotic, normal RR   Abdomen: gravid   Urinary: urethral meatus normal      Assessment:    Pregnancy: [id number] Patient Active Problem List   Diagnosis Date Noted  . Supervision of high risk pregnancy, antepartum 06/12/2017  . Indication for care in labor or delivery 09/16/2016        Plan:     Initial labs drawn. Prenatal vitamins. Problem list reviewed and  updated. Genetic Screening was too late  Ultrasound discussed; fetal survey: done at HD.  Follow up in 2 weeks. 50% of 30 min visit spent on counseling and coordination of care.  F/u US growth, need complete records from HD last pregnancy especially US   Scheryl DarterJames Ameliana Brashear 06/12/2017

## 2017-06-12 NOTE — Patient Instructions (Signed)

## 2017-06-13 NOTE — Progress Notes (Signed)
Ms Summer Barnes had elevated PHQ9/GAD7 scores today. No SI. Declines to see IBH today, I encouraged her to utilize services if she feels she needs them. Understanding voiced.

## 2017-06-16 ENCOUNTER — Encounter: Payer: Self-pay | Admitting: *Deleted

## 2017-06-16 LAB — LEAD, BLOOD: LEAD, BLOOD: NEGATIVE

## 2017-06-16 LAB — CYSTIC FIBROSIS DIAGNOSTIC STUDY

## 2017-06-16 LAB — HEPATITIS C ANTIBODY: HEP C AB: NEGATIVE

## 2017-06-16 LAB — DRUG SCREEN, URINE: DRUG SCREEN, URINE: POSITIVE

## 2017-06-16 LAB — HM PAP SMEAR: Pap Smear: NEGATIVE

## 2017-06-23 ENCOUNTER — Ambulatory Visit (HOSPITAL_COMMUNITY)
Admission: RE | Admit: 2017-06-23 | Discharge: 2017-06-23 | Disposition: A | Discharge status: Home / Self Care | Payer: Medicaid Other | Source: Ambulatory Visit | Attending: Obstetrics & Gynecology | Admitting: Obstetrics & Gynecology

## 2017-06-27 ENCOUNTER — Encounter: Payer: Self-pay | Admitting: Obstetrics & Gynecology

## 2017-06-30 ENCOUNTER — Ambulatory Visit (INDEPENDENT_AMBULATORY_CARE_PROVIDER_SITE_OTHER): Payer: Medicaid Other | Admitting: Family Medicine

## 2017-06-30 ENCOUNTER — Ambulatory Visit (INDEPENDENT_AMBULATORY_CARE_PROVIDER_SITE_OTHER): Payer: Medicaid Other | Admitting: Clinical

## 2017-06-30 VITALS — BP 114/60 | HR 83 | Wt 186.0 lb

## 2017-06-30 DIAGNOSIS — F4323 Adjustment disorder with mixed anxiety and depressed mood: Secondary | ICD-10-CM | POA: Diagnosis not present

## 2017-06-30 DIAGNOSIS — Z8759 Personal history of other complications of pregnancy, childbirth and the puerperium: Secondary | ICD-10-CM | POA: Insufficient documentation

## 2017-06-30 DIAGNOSIS — O0993 Supervision of high risk pregnancy, unspecified, third trimester: Secondary | ICD-10-CM

## 2017-06-30 DIAGNOSIS — O0990 Supervision of high risk pregnancy, unspecified, unspecified trimester: Secondary | ICD-10-CM

## 2017-06-30 NOTE — BH Specialist Note (Signed)
Integrated Behavioral Health Initial Visit  MRN: [id number] Name: Summer Barnes  Number of Integrated Behavioral Health Clinician visits:: 1/6 Session Start time: 3:50  Session End time: 4:11 Total time: 20 minutes  Type of Service: Integrated Behavioral Health- Individual/Family Interpretor:No. Interpretor Name and Language: n/a   Warm Hand Off Completed.       SUBJECTIVE: Summer Barnes is a 26 y.o. female accompanied by n/a Patient was referred by Dr Debroah LoopArnold for life stress. Patient reports the following symptoms/concerns: Pt states her primary concern today is feeling overwhelmed and uncertain about choices concerning current pregnancy; stress is preventing quality sleep and irritability. Duration of problem: Current pregnancy; Severity of problem: moderate  OBJECTIVE: Mood: Anxious and Depressed and Affect: Appropriate Risk of harm to self or others: No plan to harm self or others  LIFE CONTEXT: Family and Social: Lives with two children(2yo, 68mo) and boyfriend School/Work: - Self-Care: - Life Changes: Current pregnancy  GOALS ADDRESSED: Patient will: 1. Reduce symptoms of: anxiety and depression 2. Increase knowledge and/or ability of: coping skills  3. Demonstrate ability to: Increase healthy adjustment to current life circumstances  INTERVENTIONS: Interventions utilized: Sleep Hygiene, Psychoeducation and/or Health Education and Link to [facility]  Standardized Assessments completed: GAD-7 and PHQ 9  ASSESSMENT: Patient currently experiencing Adjustment disorder with mixed anxious and depressed mood   Patient may benefit from psychoeducation and brief therapeutic intervention regarding coping with symptoms of anxiety and depression, along with community resources.  PLAN: 1. Follow up with behavioral health clinician on : One month 2. Behavioral recommendations:  -Use sleep app nightly for improved family sleep -Read educational materials  regarding coping with symptoms of anxiety and depression -Consider contacting community resources regarding adoption agencies 3. Referral(s): [facility] ([facility]) 4. "From scale of 1-10, how likely are you to follow plan?": 9  Rae LipsJamie C McMannes, LCSW   Depression screen [facility] 2/9 06/30/2017 06/13/2017  Decreased Interest 3 3  Down, Depressed, Hopeless 3 2  PHQ - 2 Score 6 5  Altered sleeping 3 3  Tired, decreased energy 1 3  Change in appetite 0 2  Feeling bad or failure about yourself  2 1  Trouble concentrating 2 0  Moving slowly or fidgety/restless 0 0  Suicidal thoughts 0 0  PHQ-9 Score 14 14   GAD 7 : Generalized Anxiety Score 06/30/2017 06/13/2017  Nervous, Anxious, on Edge 1 1  Control/stop worrying 2 3  Worry too much - different things 2 3  Trouble relaxing 3 2  Restless 1 1  Easily annoyed or irritable 3 3  Afraid - awful might happen 1 0  Total GAD 7 Score 13 13

## 2017-06-30 NOTE — Progress Notes (Signed)
   PRENATAL VISIT NOTE  Subjective:  Summer FilbertSomarlia Barnes is a 26 y.o. [id number] at 1821w6d being seen today for ongoing prenatal care.  She is currently monitored for the following issues for this high-risk pregnancy and has Supervision of high risk pregnancy, antepartum and History of prior pregnancy with SGA newborn on their problem list.  Patient reports no complaints.  Contractions: Irritability. Vag. Bleeding: None.  Movement: Present. Denies leaking of fluid.   The following portions of the patient's history were reviewed and updated as appropriate: allergies, current medications, past family history, past medical history, past social history, past surgical history and problem list. Problem list updated.  Objective:   Vitals:   06/30/17 1612  BP: 114/60  Pulse: 83  Weight: 186 lb (84.4 kg)    Fetal Status: Fetal Heart Rate (bpm): 143   Movement: Present     General:  Alert, oriented and cooperative. Patient is in no acute distress.  Skin: Skin is warm and dry. No rash noted.   Cardiovascular: Normal heart rate noted  Respiratory: Normal respiratory effort, no problems with respiration noted  Abdomen: Soft, gravid, appropriate for gestational age.  Pain/Pressure: Present     Pelvic: Cervical exam deferred        Extremities: Normal range of motion.  Edema: None  Mental Status:  Normal mood and affect. Normal behavior. Normal judgment and thought content.   Assessment and Plan:  Pregnancy: [id number] at 6321w6d  1. Supervision of high risk pregnancy, antepartum FHT and FH normal  2. History of prior pregnancy with SGA newborn Need OB US from previous pregnancy. US for growth  Preterm labor symptoms and general obstetric precautions including but not limited to vaginal bleeding, contractions, leaking of fluid and fetal movement were reviewed in detail with the patient. Please refer to After Visit Summary for other counseling recommendations.  Return in about 2 weeks (around  07/14/2017) for HR OB f/u.   Levie HeritageJacob J Stinson, DO

## 2017-07-02 ENCOUNTER — Encounter (HOSPITAL_COMMUNITY): Payer: Self-pay | Admitting: Obstetrics & Gynecology

## 2017-07-10 ENCOUNTER — Other Ambulatory Visit: Payer: Self-pay | Admitting: Obstetrics & Gynecology

## 2017-07-10 ENCOUNTER — Encounter (HOSPITAL_COMMUNITY): Payer: Self-pay

## 2017-07-10 ENCOUNTER — Ambulatory Visit (HOSPITAL_COMMUNITY)
Admission: RE | Admit: 2017-07-10 | Discharge: 2017-07-10 | Disposition: A | Discharge status: Home / Self Care | Payer: Medicaid Other | Source: Ambulatory Visit | Attending: Obstetrics & Gynecology | Admitting: Obstetrics & Gynecology

## 2017-07-10 DIAGNOSIS — O0930 Supervision of pregnancy with insufficient antenatal care, unspecified trimester: Secondary | ICD-10-CM

## 2017-07-10 DIAGNOSIS — Z3A32 32 weeks gestation of pregnancy: Secondary | ICD-10-CM

## 2017-07-10 DIAGNOSIS — Z0379 Encounter for other suspected maternal and fetal conditions ruled out: Secondary | ICD-10-CM | POA: Diagnosis not present

## 2017-07-10 DIAGNOSIS — Z369 Encounter for antenatal screening, unspecified: Secondary | ICD-10-CM

## 2017-07-10 DIAGNOSIS — O0990 Supervision of high risk pregnancy, unspecified, unspecified trimester: Secondary | ICD-10-CM

## 2017-07-11 ENCOUNTER — Other Ambulatory Visit (HOSPITAL_COMMUNITY): Payer: Self-pay | Admitting: *Deleted

## 2017-07-11 DIAGNOSIS — O359XX0 Maternal care for (suspected) fetal abnormality and damage, unspecified, not applicable or unspecified: Secondary | ICD-10-CM

## 2017-07-17 ENCOUNTER — Telehealth: Payer: Self-pay | Admitting: General Practice

## 2017-07-17 ENCOUNTER — Other Ambulatory Visit: Payer: Self-pay | Admitting: General Practice

## 2017-07-17 ENCOUNTER — Encounter: Payer: Self-pay | Admitting: Obstetrics and Gynecology

## 2017-07-17 DIAGNOSIS — O283 Abnormal ultrasonic finding on antenatal screening of mother: Secondary | ICD-10-CM

## 2017-07-17 NOTE — Telephone Encounter (Signed)
Called [facility] to schedule appt but one has already been made for 1/3 & patient is aware of appt.

## 2017-07-17 NOTE — Telephone Encounter (Signed)
Patient missed appointment today with Dr. Jolayne Pantheronstant @ 2:00pm.  Called patient to reschedule, but no answer.  Left message on VM with new appt on 07/28/17 at 10:40am. Asked patient to give our office a call back if unable to keep this appointment.

## 2017-07-17 NOTE — Progress Notes (Unsigned)
fetal

## 2017-07-24 ENCOUNTER — Encounter (HOSPITAL_COMMUNITY): Payer: Self-pay

## 2017-07-24 ENCOUNTER — Ambulatory Visit (HOSPITAL_COMMUNITY)
Admission: RE | Admit: 2017-07-24 | Discharge: 2017-07-24 | Disposition: A | Discharge status: Home / Self Care | Payer: Medicaid Other | Source: Ambulatory Visit | Attending: Obstetrics & Gynecology | Admitting: Obstetrics & Gynecology

## 2017-07-28 ENCOUNTER — Encounter: Payer: Self-pay | Admitting: Obstetrics and Gynecology

## 2017-07-29 NOTE — L&D Delivery Note (Signed)
Patient is 27 y.o. [id number] 3128w1d admitted SOL.  Prenatal course also complicated by h/o pregnancy with SGA newborn, marijuana use during pregnancy, limited prenatal care, concern for abnormal heart views.   Delivery Note At 6:54 AM a viable female was delivered via  (Presentation: LOA  ).  APGAR: pending; weight pending.   Placenta status: spontaneous, intact.  Cord:  3 vessel   Anesthesia:  none Episiotomy:  None  Lacerations:  None  Est. Blood Loss (mL):  100 cc   Head delivered LOA. No nuchal cord present. Shoulder and body delivered in usual fashion. Infant with spontaneous cry, placed on mother's abdomen, dried and bulb suctioned. Cord clamped x 2 after 1-minute delay, and cut by CNM. Cord blood drawn. Placenta delivered spontaneously with gentle cord traction. Fundus firm with massage and Pitocin. Perineum inspected and found to have no laceration. No skin to skin, baby immediately taken to nursery.   Mom to postpartum.  Baby to Nursery.  Oralia ManisSherin Abraham, DO PGY-1 09/03/2017, 7:06 AM  I confirm that I have verified the information documented in the resident's note and that I have also personally reperformed the physical exam and all medical decision making activities.  I was gloved and present for entire delivery SVD without incident No difficulty with shoulders No lacerations  Aviva SignsMarie L Lavone Barrientes, CNM

## 2017-08-11 ENCOUNTER — Encounter: Payer: Self-pay | Admitting: Obstetrics and Gynecology

## 2017-08-11 ENCOUNTER — Ambulatory Visit (INDEPENDENT_AMBULATORY_CARE_PROVIDER_SITE_OTHER): Payer: Medicaid Other | Admitting: Obstetrics and Gynecology

## 2017-08-11 VITALS — BP 106/66 | HR 109 | Wt 186.0 lb

## 2017-08-11 DIAGNOSIS — Z8759 Personal history of other complications of pregnancy, childbirth and the puerperium: Secondary | ICD-10-CM

## 2017-08-11 DIAGNOSIS — Z113 Encounter for screening for infections with a predominantly sexual mode of transmission: Secondary | ICD-10-CM | POA: Diagnosis not present

## 2017-08-11 DIAGNOSIS — N750 Cyst of Bartholin's gland: Secondary | ICD-10-CM

## 2017-08-11 DIAGNOSIS — O0990 Supervision of high risk pregnancy, unspecified, unspecified trimester: Secondary | ICD-10-CM

## 2017-08-11 DIAGNOSIS — O0993 Supervision of high risk pregnancy, unspecified, third trimester: Secondary | ICD-10-CM

## 2017-08-11 DIAGNOSIS — Z0373 Encounter for suspected fetal anomaly ruled out: Secondary | ICD-10-CM | POA: Diagnosis not present

## 2017-08-11 NOTE — Progress Notes (Signed)
Pt's screening numbers high. States has already spoken with Asher MuirJamie with [facility] and she gave her some things to read. O2 sat 100

## 2017-08-11 NOTE — Progress Notes (Signed)
   PRENATAL VISIT NOTE  Subjective:  Summer Barnes is a 27 y.o. [id number] at 7025w6d being seen today for ongoing prenatal care.  She is currently monitored for the following issues for this high-risk pregnancy and has Supervision of high risk pregnancy, antepartum; History of prior pregnancy with SGA newborn; and Fetal anomaly suspected but not found on their problem list.  Patient reports discomfort, daily contractions.  Contractions: Irregular. Vag. Bleeding: None.  Movement: Present. Denies leaking of fluid.   The following portions of the patient's history were reviewed and updated as appropriate: allergies, current medications, past family history, past medical history, past social history, past surgical history and problem list. Problem list updated.  Objective:   Vitals:   08/11/17 1635  BP: 106/66  Pulse: (!) 109  Weight: 186 lb (84.4 kg)    Fetal Status: Fetal Heart Rate (bpm): 140 Fundal Height: 37 cm Movement: Present     General:  Alert, oriented and cooperative. Patient is in no acute distress.  Skin: Skin is warm and dry. No rash noted.   Cardiovascular: Normal heart rate noted  Respiratory: Normal respiratory effort, no problems with respiration noted  Abdomen: Soft, gravid, appropriate for gestational age.  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  Edema: None  Mental Status:  Normal mood and affect. Normal behavior. Normal judgment and thought content.   Assessment and Plan:  Pregnancy: [id number] at 5825w6d  1. Supervision of high risk pregnancy, antepartum - CBC - HIV antibody (with reflex) - RPR - HgB A1c - AMB referral to maternal fetal medicine - US MFM OB FOLLOW UP; Future - Cervicovaginal ancillary only - Strep Gp B NAA  2. History of prior pregnancy with SGA newborn  3. Fetal anomaly suspected but not found - AMB  referral to maternal fetal medicine Fetal echo scheduled but patient did not attend due to lack of transportation - US MFM OB FOLLOW UP; Future  4. Bartholin cyst - patient reports she has had this for a long time, has been I&D once a long time ago, puts warm compresses on it and it improves, does not cause pain Offered to I&D, she accepts, reviewed risks/benefits I&D done bedside -consent signed, adequate timeout performed, left introitus cleansed with betadine, 0.5 mL lidocaine without epinephrine injected subcutaneously, small incision made at left hymenal ring with copious expulsion of chocolate fluid, culture taken and gland milked to expel all fluid, no bleeding at close of procedure - Anaerobic and Aerobic Culture  Preterm labor symptoms and general obstetric precautions including but not limited to vaginal bleeding, contractions, leaking of fluid and fetal movement were reviewed in detail with the patient. Please refer to After Visit Summary for other counseling recommendations.  Return in about 1 week (around 08/18/2017) for OB visit (MD).   Conan BowensKelly M Varnell Donate, MD

## 2017-08-11 NOTE — Progress Notes (Signed)
Scheduled for MFM MD consult 08/13/17 and US 08/19/17. Called Dr.Cotton and left message we are calling to reschedule her fetal echo-asap - please call us back with appointment.

## 2017-08-13 LAB — STREP GP B NAA: STREP GROUP B AG: NEGATIVE

## 2017-08-13 LAB — CERVICOVAGINAL ANCILLARY ONLY
Bacterial vaginitis: NEGATIVE
CHLAMYDIA, DNA PROBE: NEGATIVE
Candida vaginitis: NEGATIVE
NEISSERIA GONORRHEA: NEGATIVE
Trichomonas: NEGATIVE

## 2017-08-14 ENCOUNTER — Ambulatory Visit (HOSPITAL_COMMUNITY): Payer: Medicaid Other

## 2017-08-14 ENCOUNTER — Ambulatory Visit (HOSPITAL_COMMUNITY)
Admission: RE | Admit: 2017-08-14 | Discharge: 2017-08-14 | Disposition: A | Discharge status: Home / Self Care | Payer: Medicaid Other | Source: Ambulatory Visit | Attending: Obstetrics and Gynecology | Admitting: Obstetrics and Gynecology

## 2017-08-15 LAB — ANAEROBIC AND AEROBIC CULTURE

## 2017-08-19 ENCOUNTER — Ambulatory Visit (HOSPITAL_COMMUNITY): Payer: Self-pay

## 2017-08-19 ENCOUNTER — Encounter: Payer: Self-pay | Admitting: Family Medicine

## 2017-08-19 ENCOUNTER — Encounter: Payer: Self-pay | Admitting: *Deleted

## 2017-08-22 ENCOUNTER — Ambulatory Visit (HOSPITAL_COMMUNITY)
Admission: RE | Admit: 2017-08-22 | Discharge: 2017-08-22 | Disposition: A | Discharge status: Home / Self Care | Payer: Medicaid Other | Source: Ambulatory Visit | Attending: Obstetrics and Gynecology | Admitting: Obstetrics and Gynecology

## 2017-08-25 ENCOUNTER — Encounter: Payer: Self-pay | Admitting: Family Medicine

## 2017-09-01 ENCOUNTER — Encounter: Payer: Self-pay | Admitting: Family Medicine

## 2017-09-03 ENCOUNTER — Inpatient Hospital Stay (HOSPITAL_COMMUNITY): Payer: Medicaid Other | Admitting: Anesthesiology

## 2017-09-03 ENCOUNTER — Encounter (HOSPITAL_COMMUNITY): Payer: Self-pay | Admitting: Emergency Medicine

## 2017-09-03 ENCOUNTER — Other Ambulatory Visit: Payer: Self-pay

## 2017-09-03 ENCOUNTER — Inpatient Hospital Stay (HOSPITAL_COMMUNITY)
Admission: AD | Admit: 2017-09-03 | Discharge: 2017-09-04 | DRG: 807 | Disposition: A | Discharge status: Home / Self Care | Payer: Medicaid Other | Source: Ambulatory Visit | Attending: Family Medicine | Admitting: Family Medicine

## 2017-09-03 DIAGNOSIS — Z3A40 40 weeks gestation of pregnancy: Secondary | ICD-10-CM

## 2017-09-03 DIAGNOSIS — Z87891 Personal history of nicotine dependence: Secondary | ICD-10-CM | POA: Diagnosis not present

## 2017-09-03 DIAGNOSIS — O479 False labor, unspecified: Secondary | ICD-10-CM | POA: Diagnosis present

## 2017-09-03 DIAGNOSIS — O0930 Supervision of pregnancy with insufficient antenatal care, unspecified trimester: Secondary | ICD-10-CM

## 2017-09-03 DIAGNOSIS — O0990 Supervision of high risk pregnancy, unspecified, unspecified trimester: Secondary | ICD-10-CM

## 2017-09-03 DIAGNOSIS — F1290 Cannabis use, unspecified, uncomplicated: Secondary | ICD-10-CM | POA: Diagnosis present

## 2017-09-03 DIAGNOSIS — O99324 Drug use complicating childbirth: Principal | ICD-10-CM | POA: Diagnosis present

## 2017-09-03 DIAGNOSIS — Z3490 Encounter for supervision of normal pregnancy, unspecified, unspecified trimester: Secondary | ICD-10-CM

## 2017-09-03 DIAGNOSIS — Z0373 Encounter for suspected fetal anomaly ruled out: Secondary | ICD-10-CM | POA: Diagnosis present

## 2017-09-03 DIAGNOSIS — Z3483 Encounter for supervision of other normal pregnancy, third trimester: Secondary | ICD-10-CM | POA: Diagnosis present

## 2017-09-03 LAB — TYPE AND SCREEN
ABO/RH(D): O POS
Antibody Screen: NEGATIVE

## 2017-09-03 LAB — HIV ANTIBODY (ROUTINE TESTING W REFLEX): HIV SCREEN 4TH GENERATION: NONREACTIVE

## 2017-09-03 LAB — CBC
HCT: 32.3 % — ABNORMAL LOW (ref 36.0–46.0)
HEMATOCRIT: 34.1 % — AB (ref 36.0–46.0)
HEMOGLOBIN: 10.6 g/dL — AB (ref 12.0–15.0)
HEMOGLOBIN: 10.9 g/dL — AB (ref 12.0–15.0)
MCH: 25.7 pg — AB (ref 26.0–34.0)
MCH: 25.6 pg — ABNORMAL LOW (ref 26.0–34.0)
MCHC: 32.8 g/dL (ref 30.0–36.0)
MCHC: 32 g/dL (ref 30.0–36.0)
MCV: 78.4 fL (ref 78.0–100.0)
MCV: 80 fL (ref 78.0–100.0)
PLATELETS: 119 10*3/uL — AB (ref 150–400)
Platelets: 109 10*3/uL — ABNORMAL LOW (ref 150–400)
RBC: 4.12 MIL/uL (ref 3.87–5.11)
RBC: 4.26 MIL/uL (ref 3.87–5.11)
RDW: 15.5 % (ref 11.5–15.5)
RDW: 15.6 % — ABNORMAL HIGH (ref 11.5–15.5)
WBC: 6.7 10*3/uL (ref 4.0–10.5)
WBC: 8.6 10*3/uL (ref 4.0–10.5)

## 2017-09-03 LAB — RPR: RPR Ser Ql: NONREACTIVE

## 2017-09-03 MED ORDER — ONDANSETRON HCL 4 MG/2ML IJ SOLN
4.0000 mg | Freq: Four times a day (QID) | INTRAMUSCULAR | Status: DC | PRN
  Administered 2017-09-03: 4 mg via INTRAVENOUS
  Filled 2017-09-03: qty 2

## 2017-09-03 MED ORDER — ONDANSETRON HCL 4 MG PO TABS: 4.0000 mg | ORAL_TABLET | ORAL | Status: DC | PRN

## 2017-09-03 MED ORDER — PRENATAL MULTIVITAMIN CH
1.0000 | ORAL_TABLET | Freq: Every day | ORAL | Status: DC
  Administered 2017-09-03: 1 via ORAL
  Filled 2017-09-03: qty 1

## 2017-09-03 MED ORDER — LIDOCAINE HCL (PF) 1 % IJ SOLN
30.0000 mL | INTRAMUSCULAR | Status: DC | PRN
  Filled 2017-09-03: qty 30

## 2017-09-03 MED ORDER — TETANUS-DIPHTH-ACELL PERTUSSIS 5-2.5-18.5 LF-MCG/0.5 IM SUSP: 0.5000 mL | Freq: Once | INTRAMUSCULAR | Status: DC

## 2017-09-03 MED ORDER — COCONUT OIL OIL: 1.0000 "application " | TOPICAL_OIL | Status: DC | PRN

## 2017-09-03 MED ORDER — ACETAMINOPHEN 325 MG PO TABS
650.0000 mg | ORAL_TABLET | ORAL | Status: DC | PRN
  Administered 2017-09-03: 650 mg via ORAL
  Filled 2017-09-03 (×2): qty 2

## 2017-09-03 MED ORDER — PHENYLEPHRINE 40 MCG/ML (10ML) SYRINGE FOR IV PUSH (FOR BLOOD PRESSURE SUPPORT)
80.0000 ug | PREFILLED_SYRINGE | INTRAVENOUS | Status: DC | PRN
  Filled 2017-09-03: qty 5

## 2017-09-03 MED ORDER — DIPHENHYDRAMINE HCL 25 MG PO CAPS: 25.0000 mg | ORAL_CAPSULE | Freq: Four times a day (QID) | ORAL | Status: DC | PRN

## 2017-09-03 MED ORDER — ZOLPIDEM TARTRATE 5 MG PO TABS: 5.0000 mg | ORAL_TABLET | Freq: Every evening | ORAL | Status: DC | PRN

## 2017-09-03 MED ORDER — SIMETHICONE 80 MG PO CHEW: 80.0000 mg | CHEWABLE_TABLET | ORAL | Status: DC | PRN

## 2017-09-03 MED ORDER — DIBUCAINE 1 % RE OINT: 1.0000 "application " | TOPICAL_OINTMENT | RECTAL | Status: DC | PRN

## 2017-09-03 MED ORDER — LIDOCAINE HCL (PF) 1 % IJ SOLN
INTRAMUSCULAR | Status: DC | PRN
  Administered 2017-09-03: 4 mL via EPIDURAL
  Administered 2017-09-03: 8 mL via EPIDURAL

## 2017-09-03 MED ORDER — LACTATED RINGERS IV SOLN
500.0000 mL | INTRAVENOUS | Status: DC | PRN
  Administered 2017-09-03: 1000 mL via INTRAVENOUS

## 2017-09-03 MED ORDER — OXYTOCIN 40 UNITS IN LACTATED RINGERS INFUSION - SIMPLE MED
2.5000 [IU]/h | INTRAVENOUS | Status: DC
  Filled 2017-09-03: qty 1000

## 2017-09-03 MED ORDER — DIPHENHYDRAMINE HCL 50 MG/ML IJ SOLN: 12.5000 mg | INTRAMUSCULAR | Status: DC | PRN

## 2017-09-03 MED ORDER — ERYTHROMYCIN 5 MG/GM OP OINT
TOPICAL_OINTMENT | OPHTHALMIC | Status: AC
  Filled 2017-09-03: qty 1

## 2017-09-03 MED ORDER — FENTANYL 2.5 MCG/ML BUPIVACAINE 1/10 % EPIDURAL INFUSION (WH - ANES)
14.0000 mL/h | INTRAMUSCULAR | Status: DC | PRN
  Filled 2017-09-03: qty 100

## 2017-09-03 MED ORDER — SOD CITRATE-CITRIC ACID 500-334 MG/5ML PO SOLN: 30.0000 mL | ORAL | Status: DC | PRN

## 2017-09-03 MED ORDER — OXYCODONE-ACETAMINOPHEN 5-325 MG PO TABS: 1.0000 | ORAL_TABLET | ORAL | Status: DC | PRN

## 2017-09-03 MED ORDER — EPHEDRINE 5 MG/ML INJ
10.0000 mg | INTRAVENOUS | Status: DC | PRN
  Filled 2017-09-03: qty 2

## 2017-09-03 MED ORDER — BENZOCAINE-MENTHOL 20-0.5 % EX AERO: 1.0000 "application " | INHALATION_SPRAY | CUTANEOUS | Status: DC | PRN

## 2017-09-03 MED ORDER — WITCH HAZEL-GLYCERIN EX PADS: 1.0000 "application " | MEDICATED_PAD | CUTANEOUS | Status: DC | PRN

## 2017-09-03 MED ORDER — OXYCODONE HCL 5 MG PO TABS
5.0000 mg | ORAL_TABLET | ORAL | Status: DC | PRN
  Administered 2017-09-03 – 2017-09-04 (×4): 5 mg via ORAL
  Filled 2017-09-03 (×4): qty 1

## 2017-09-03 MED ORDER — OXYTOCIN BOLUS FROM INFUSION: 500.0000 mL | Freq: Once | INTRAVENOUS | Status: DC

## 2017-09-03 MED ORDER — PHENYLEPHRINE 40 MCG/ML (10ML) SYRINGE FOR IV PUSH (FOR BLOOD PRESSURE SUPPORT)
80.0000 ug | PREFILLED_SYRINGE | INTRAVENOUS | Status: DC | PRN
  Filled 2017-09-03: qty 5
  Filled 2017-09-03: qty 10

## 2017-09-03 MED ORDER — SENNOSIDES-DOCUSATE SODIUM 8.6-50 MG PO TABS
2.0000 | ORAL_TABLET | ORAL | Status: DC
  Administered 2017-09-04: 2 via ORAL
  Filled 2017-09-03: qty 2

## 2017-09-03 MED ORDER — ACETAMINOPHEN 325 MG PO TABS
650.0000 mg | ORAL_TABLET | ORAL | Status: DC | PRN
  Administered 2017-09-03: 650 mg via ORAL
  Filled 2017-09-03: qty 2

## 2017-09-03 MED ORDER — LACTATED RINGERS IV SOLN: INTRAVENOUS | Status: DC

## 2017-09-03 MED ORDER — LACTATED RINGERS IV SOLN: 500.0000 mL | Freq: Once | INTRAVENOUS | Status: DC

## 2017-09-03 MED ORDER — IBUPROFEN 600 MG PO TABS
600.0000 mg | ORAL_TABLET | Freq: Four times a day (QID) | ORAL | Status: DC
  Administered 2017-09-03 – 2017-09-04 (×5): 600 mg via ORAL
  Filled 2017-09-03 (×5): qty 1

## 2017-09-03 MED ORDER — OXYCODONE-ACETAMINOPHEN 5-325 MG PO TABS: 2.0000 | ORAL_TABLET | ORAL | Status: DC | PRN

## 2017-09-03 MED ORDER — ONDANSETRON HCL 4 MG/2ML IJ SOLN: 4.0000 mg | INTRAMUSCULAR | Status: DC | PRN

## 2017-09-03 NOTE — Clinical Social Work Maternal (Signed)
CLINICAL SOCIAL WORK MATERNAL/CHILD NOTE  Patient Details  Name: Summer Barnes MRN: [id number] Date of Birth: 11/16/1990  Date:  09/03/2017  Clinical Social Worker Initiating Note:  Zacari Radick Boyd-Gilyard Date/Time: Initiated:  09/03/17/1204     Child's Name:  unknown   Biological Parents:  Mother(MOB would not provide any information about FOB. .)   Need for Interpreter:  None   Reason for Referral:  Late or No Prenatal Care , Adoption, Current Substance Use/Substance Use During Pregnancy    Address:  [address] [place] [address]    Phone number:  [phone] (home)     Additional phone number:   Household Members/Support Persons (HM/SP):   Household Member/Support Person 1, Household Member/Support Person 2   HM/SP Name Relationship DOB or Age  HM/SP -1 Gregory Matthew Son 05/16/2015  HM/SP -2 Paris Matthews daughter 09/16/2016  HM/SP -3        HM/SP -4        HM/SP -5        HM/SP -6        HM/SP -7        HM/SP -8          Natural Supports (not living in the home):  Friends(MOB reports having the support of a friend that resides MOB.  MOB did not want to friend information. )   Professional Supports: Other (Comment)(adoption coordinator with [facility]. )   Employment: Unemployed   Type of Work:     Education:  College graduate   Homebound arranged:    Financial Resources:  Medicaid   Other Resources:  Other (Comment)(MOB has resources for post adoption counseling. )   Cultural/Religious Considerations Which May Impact Care:  None Reported  Strengths:  Other (Comment)(MOB was receptive to post counseling services and was receptive to meeting with CSW. )   Psychotropic Medications:         Pediatrician:       Pediatrician List:   [place]    [place]    [place]    [place]    [place]    [place]      Pediatrician Fax Number:    Risk Factors/Current Problems:  Substance Use     Cognitive State:  Alert , Able to Concentrate , Insightful , Linear Thinking , Goal Oriented    Mood/Affect:  Tearful , Sad , Comfortable , Flat    CSW Assessment: CSW met with MOB in room 324 to complete an assessment for adoption, hx of SA, and limited PNC.  When CSW arrived, MOB was resting in bed. CSW explained CSW's role and encouraged MOB to ask questions.  MOB was soft spoken, tearful, polite, and receptive to meeting with CSW.   CSW asked about MOB's plan for adoption and MOB confirmed that MOB wants to place the infant up for adoption with [facility].  MOB shared that MOB has met with Adoption Coordinator, Tracy Dansbeer,and MOB expressed  feeling good about her decision. MOB was tearful and explained, "I know that I am making the right."  CSW validated MOB's thoughts and feelings and assured MOB that CSW was there to provide support and resources. CSW provided education regarding the baby blues period vs. perinatal mood disorders, discussed treatment and gave resources for mental health follow up if concerns arise.  CSW recommends self-evaluation during the postpartum time period using the New Mom Checklist from Postpartum Progress and encouraged MOB to contact a medical professional if   symptoms are noted at any time.    precautions.  CSW presented with insight and awareness about her MH and did not demonstrate any acute signs or symptoms.   CSW asked about MOB's SA hx and MOB admitted to the use of marijuana throughout pregnancy.  MOB reported MOB's last use of marijuana was less than 30 days ago.  MOB also shared that MOB took a pain pill that was prescribed to MOB's grandmother about 2 months ago (pill name unknown). MOB denied the use of all other illicit substance.  CSW explained hospital's SA policy and MOB was understanding.  MOB shared that MOB remembers from MOB's last delivery eeting with a CSW regarding substance use during pregnancy.  CSW shared with MOB  that if infant's UDS and CDS are positive and MOB change her mind about adoption, a report will be made to [place] CPS. CSW offered MOB resources for SA counseling and MOB declined.   After CSW consulted with bedside nurse regarding MOB's medication intake, CSW met with MOB and [facility] representative, Tracy Dansbeer, to complete and sign all necessary adoption documents. MOB denied having any questions and appeared confident in her decision; all documents were signed.  CSW update bedside nurse and CN that all documents were completed.  A copy of documents were left with CN charge nurse. CN is also aware that adopting parents are planning to stay at the hospital if a room can be provided.   CSW identifies no further need for intervention and no barriers to discharge at this time.  CSW Plan/Description:  No Further Intervention Required/No Barriers to Discharge, Perinatal Mood and Anxiety Disorder (PMADs) Education, Hospital Drug Screen Policy Information, CSW Will Continue to Monitor Umbilical Cord Tissue Drug Screen Results and Make Report if Warranted, Other Information/Referral to Community Resources   Aydian Dimmick Boyd-Gilyard, MSW, LCSW Clinical Social Work [phone]  Galan Ghee D BOYD-GILYARD, LCSW 09/03/2017, 12:11 PM 

## 2017-09-03 NOTE — Anesthesia Procedure Notes (Signed)
Epidural Patient location during procedure: OB Start time: 09/03/2017 6:46 AM End time: 09/03/2017 6:49 AM  Staffing Anesthesiologist: Beryle LatheBrock, Thomas E, MD Performed: anesthesiologist   Preanesthetic Checklist Completed: patient identified, pre-op evaluation, timeout performed, IV checked, risks and benefits discussed and monitors and equipment checked  Epidural Patient position: sitting Prep: DuraPrep Patient monitoring: continuous pulse ox and blood pressure Approach: midline Location: L2-L3 Injection technique: LOR saline  Needle:  Needle type: Tuohy  Needle gauge: 17 G Needle length: 9 cm Needle insertion depth: 6 cm Catheter size: 19 Gauge Catheter at skin depth: 11 cm Test dose: negative and Other (1% lidocaine)  Additional Notes Patient identified. Risks including, but not limited to, bleeding, infection, nerve damage, paralysis, inadequate analgesia, blood pressure changes, nausea, vomiting, allergic reaction, postpartum back pain, itching, and headache were discussed. Patient expressed understanding and wished to proceed. Sterile prep and drape, including hand hygiene, mask, and sterile gloves were used. The patient was positioned and the spine was prepped. The skin was anesthetized with lidocaine. No paraesthesia or other complication noted. The patient did not experience any signs of intravascular injection such as tinnitus or metallic taste in mouth, nor signs of intrathecal spread such as rapid motor block. Please see nursing notes for vital signs. The patient tolerated the procedure well.   Leslye Peerhomas Brock, MDReason for block:procedure for pain

## 2017-09-03 NOTE — H&P (Signed)
LABOR AND DELIVERY ADMISSION HISTORY AND PHYSICAL NOTE  Summer Barnes is a 27 y.o. female [phone] with IUP at [redacted]w[redacted]d by 29 week ultrasound presenting for SOL. Patient had contractions every 8 min apart at home which brought her to MAU. Denies elevated pressures during pregnancy, denies HA or vision changes.  She reports positive fetal movement. She denies leakage of fluid or vaginal bleeding. Reports marijuana use during pregnancy with last use 2 weeks ago.  Patient reports that she will be giving child for closed adoption.   Prenatal History/Complications: PNC at [facility] Pregnancy complications:  - h/o pregnancy with SGA newborn  - marijuana use during pregnancy  - limited prenatal care  - concern for abnormal heart views   Sono: @[redacted]w[redacted]d , CWD, concern for abnormal heart views, cephalic presentation, placenta posterior above cervical os, 1932g, 56% EFW  Past Medical History: History reviewed. No pertinent past medical history.  Past Surgical History: Past Surgical History:  Procedure Laterality Date  . NO PAST SURGERIES      Obstetrical History: OB History    Gravida Para Term Preterm AB Living   4 2 2   1 2    SAB TAB Ectopic Multiple Live Births   1     0 2      Social History: Social History   Socioeconomic History  . Marital status: Single    Spouse name: None  . Number of children: None  . Years of education: None  . Highest education level: None  Social Needs  . Financial resource strain: None  . Food insecurity - worry: None  . Food insecurity - inability: None  . Transportation needs - medical: None  . Transportation needs - non-medical: None  Occupational History  . None  Tobacco Use  . Smoking status: Former Smoker    Last attempt to quit: 04/10/2017    Years since quitting: 0.4  . Smokeless tobacco: Never Used  . Tobacco comment: 1 cigarette/day  Substance and Sexual Activity  . Alcohol use: No    Frequency: Never  . Drug use: Yes    Types: Marijuana   . Sexual activity: Yes    Birth control/protection: None  Other Topics Concern  . None  Social History Narrative  . None    Family History: Family History  Problem Relation Age of Onset  . Arthritis Maternal Grandmother   . Anemia Maternal Grandmother     Allergies: No Known Allergies  Medications Prior to Admission  Medication Sig Dispense Refill Last Dose  . Prenatal Vit-Fe Fumarate-FA (PRENATAL MULTIVITAMIN) TABS tablet Take 1 tablet by mouth daily at 12 noon.   Taking     Review of Systems  All systems reviewed and negative except as stated in HPI  Physical Exam Blood pressure 121/72, pulse (!) 106, temperature 98.1 F (36.7 C), temperature source Oral, height 5\' 4"  (1.626 m), weight 84.8 kg (187 lb), last menstrual period 10/29/2016, SpO2 100 %, unknown if currently breastfeeding. General appearance: alert, oriented, NAD Lungs: normal respiratory effort Heart: regular rate Abdomen: soft, non-tender; gravid, FH appropriate for GA Extremities: No calf swelling or tenderness Presentation: cephalic per MAU exam  Fetal monitoring: FHR 140, moderate variability, +accels, - decels  Uterine activity: irregular, every 5-6 min  Dilation: 6.5 Effacement (%): 80 Station: -1 Exam by:: debra callaway, RN   Prenatal labs: ABO, Rh: O/Positive/-- (10/29 0000) Antibody: Negative (10/29 0000) Rubella: Immune (10/29 0000) RPR: Nonreactive (10/29 0000)  HBsAg: Negative (10/29 0000)  HIV:   pending  GC/Chlamydia: negative 08/11/17 GBS: Negative (01/14 1821)  2-hr GTT: no records  Genetic screening:  No records  Anatomy US: concern for abnormal heart views  Prenatal Transfer Tool  Maternal Diabetes: No Genetic Screening: no records  Maternal Ultrasounds/Referrals: Abnormal:  Findings:   Fetal Heart Anomalies Fetal Ultrasounds or other Referrals:  Fetal echo recommended but not obtained Maternal Substance Abuse:  Yes:  Type: Marijuana Significant Maternal Medications:   None Significant Maternal Lab Results: Lab values include: Group B Strep negative  No results found for this or any previous visit (from the past 24 hour(s)).  Patient Active Problem List   Diagnosis Date Noted  . Insufficient prenatal care 09/03/2017  . Pregnancy with adoption planned, currently in third trimester 09/03/2017  . Uterine contractions during pregnancy 09/03/2017  . Fetal anomaly suspected but not found 08/11/2017  . History of prior pregnancy with SGA newborn 06/30/2017  . Supervision of high risk pregnancy, antepartum 06/12/2017    Assessment: Summer Barnes is a 27 y.o. [id number] at 2737w1d here for SOL  #Labor: anticipate NSVD #Pain: Per patient request, would like epidural  #FWB: Category 1  #ID:  GBS neg #MOF: N/A  #MOC:IUD #Circ:  N/A  Oralia ManisSherin Abraham, DO PGY-1 09/03/2017, 5:54 AM  I confirm that I have verified the information documented in the resident's note and that I have also personally reperformed the physical exam and all medical decision making activities.  The patient was seen and examined by me also Agree with note NST reactive and reassuring UCs as listed Cervical exams as listed in note Admit for delivery Aviva SignsWilliams, Hue Frick L, CNM

## 2017-09-03 NOTE — MAU Note (Signed)
Pt presents with contractions that started @2  am. Pt denies LOF & bleeding. Pt denies herpes. +FM

## 2017-09-03 NOTE — Progress Notes (Signed)
CSW met with MOB in room 324. When CSW arrived, MOB appeared tearful while watching TV.  CSW attempted to inquire about MOB's thoughts and feelings however, MOB expressed that MOB was not interested in tag to d/c tomorrow to get home tolking. CSW offered to return tomorrow morning and MOB declined the offer.  MOB shared that MOB is hopin her other children.  CSW was respectful of MOB not wanting to talk at this time.   MOB's bedside nurse shared with CSW that MOB's EDPS was a 26.  CSW will attempt to meet with MOB again prior to her d/c to assess for safety.   Louellen Haldeman Boyd-Gilyard, MSW, LCSW Clinical Social Work [phone] 

## 2017-09-03 NOTE — Anesthesia Postprocedure Evaluation (Signed)
Anesthesia Post Note  Patient: Shirin Narain  Procedure(s) Performed: AN AD HOC LABOR EPIDURAL     Patient location during evaluation: Women's Unit Anesthesia Type: Epidural Level of consciousness: awake Pain management: pain level controlled Vital Signs Assessment: post-procedure vital signs reviewed and stable Respiratory status: spontaneous breathing Cardiovascular status: stable Postop Assessment: no headache, no backache, epidural receding, patient able to bend at knees, no apparent nausea or vomiting and adequate PO intake Anesthetic complications: no    Last Vitals:  Vitals:   09/03/17 0832 09/03/17 1030  BP:  116/68  Pulse:  87  Resp: 20 18  Temp:  36.8 C  SpO2:  100%    Last Pain:  Vitals:   09/03/17 1200  TempSrc:   PainSc: 2    Pain Goal: Patients Stated Pain Goal: 2 (09/03/17 1000)               Dalila Arca

## 2017-09-03 NOTE — Anesthesia Preprocedure Evaluation (Signed)
Anesthesia Evaluation  Patient identified by MRN, date of birth, ID band Patient awake    Reviewed: Allergy & Precautions, NPO status , Patient's Chart, lab work & pertinent test results  Airway Mallampati: II  TM Distance: >3 FB Neck ROM: Full    Dental  (+) Dental Advisory Given   Pulmonary former smoker,    Pulmonary exam normal breath sounds clear to auscultation       Cardiovascular negative cardio ROS Normal cardiovascular exam Rhythm:Regular Rate:Normal     Neuro/Psych negative neurological ROS  negative psych ROS   GI/Hepatic negative GI ROS, Neg liver ROS,   Endo/Other  negative endocrine ROS  Renal/GU negative Renal ROS  negative genitourinary   Musculoskeletal negative musculoskeletal ROS (+)   Abdominal   Peds  Hematology Thrombocytopenia   Anesthesia Other Findings   Reproductive/Obstetrics (+) Pregnancy                             Anesthesia Physical Anesthesia Plan  ASA: II  Anesthesia Plan: Epidural   Post-op Pain Management:    Induction:   PONV Risk Score and Plan:   Airway Management Planned: Natural Airway  Additional Equipment:   Intra-op Plan:   Post-operative Plan:   Informed Consent: I have reviewed the patients History and Physical, chart, labs and discussed the procedure including the risks, benefits and alternatives for the proposed anesthesia with the patient or authorized representative who has indicated his/her understanding and acceptance.     Plan Discussed with:   Anesthesia Plan Comments: (Labs reviewed. Platelets acceptable, patient not taking any blood thinning medications. Risks and benefits discussed with patient, patient expressed understanding and wished to proceed.)        Anesthesia Quick Evaluation

## 2017-09-04 MED ORDER — IBUPROFEN 600 MG PO TABS: 600.0000 mg | ORAL_TABLET | Freq: Four times a day (QID) | ORAL | 0 refills | Status: DC

## 2017-09-04 MED ORDER — DOCUSATE SODIUM 100 MG PO CAPS: 100.0000 mg | ORAL_CAPSULE | Freq: Two times a day (BID) | ORAL | 3 refills | Status: DC

## 2017-09-04 MED ORDER — SERTRALINE HCL 25 MG PO TABS: 25.0000 mg | ORAL_TABLET | Freq: Every day | ORAL | 0 refills | Status: DC

## 2017-09-04 MED ORDER — ONDANSETRON 8 MG PO TBDP
8.0000 mg | ORAL_TABLET | Freq: Three times a day (TID) | ORAL | Status: DC | PRN
  Filled 2017-09-04: qty 1

## 2017-09-04 MED ORDER — DOCUSATE SODIUM 100 MG PO CAPS: 100.0000 mg | ORAL_CAPSULE | Freq: Two times a day (BID) | ORAL | Status: DC | PRN

## 2017-09-04 MED ORDER — OXYCODONE HCL 5 MG PO TABS: 5.0000 mg | ORAL_TABLET | ORAL | 0 refills | Status: DC | PRN

## 2017-09-04 MED ORDER — SERTRALINE HCL 50 MG PO TABS: 50.0000 mg | ORAL_TABLET | Freq: Every day | ORAL | 3 refills | Status: DC

## 2017-09-04 NOTE — Discharge Summary (Signed)
   Physician Discharge Summary  Patient ID: Summer Barnes MRN: [id number] DOB/AGE: 12-10-1990 27 y.o.  Admit date: 09/03/2017 Discharge date: 09/04/2017   Discharge Diagnoses:  Principal Problem:   Vaginal delivery Active Problems:   Supervision of high risk pregnancy, antepartum   Fetal anomaly suspected but not found   Insufficient prenatal care   Pregnancy with adoption planned   Uterine contractions during pregnancy   Hospital Course: Please see HPI dated 09/03/2017 for details. This is a 27 y.o. [id number] now PPD#1 from a spontaneous vaginal delivery. She presented in spontaneous labor. Her antepartum course was complicated by a suspected fetal heart defect for which she did not have a fetal echo done. Her post partum course has been uncomplicated. By day 1, she was ambulating, passing flatus and pain was well controlled. She was discharged home HD#2 in good condition. Instructions for follow up given.   She is placing baby for adoption and is understandably upset about her decision but affirms that she feels it is the right decision. She was very emotional with interview and feels she will do better once she is out of the hospital. She is medically cleared. Reviewed s/s depression, she denies suicidal/homicidal ideation. She is agreeable to presenting to hospital if she has any concerns about her safety. Agreeable to speak with social work prior to discharge, she is agreeable to see behavioral health next week.   Physical exam  Vitals:   09/04/17 0022 09/04/17 0500 09/04/17 0542 09/04/17 0823  BP: 128/82 124/74  121/82  Pulse: 96 80  (!) 101  Resp: 16 16  18   Temp: 98.2 F (36.8 C) 98.2 F (36.8 C)  98.3 F (36.8 C)  TempSrc: Oral Oral  Oral  SpO2: 100% 99%  100%  Weight:   175 lb 12 oz (79.7 kg)   Height:       Physical Exam:  General: alert, oriented, cooperative Chest: CTAB, normal respiratory effort Heart: RRR  Abdomen: soft, appropriately tender to palpation  Uterine  Fundus: firm, 2 fingers below the umbilicus Lochia: moderate, rubra DVT Evaluation: no evidence of DVT Extremities: no edema, no calf tenderness   Postpartum contraception: IUD undecided about type  Disposition: home  Discharged Condition: fair  Discharge Instructions    Diet - low sodium heart healthy   Complete by:  As directed      Allergies as of 09/04/2017   No Known Allergies     Medication List    STOP taking these medications   prenatal multivitamin Tabs tablet     TAKE these medications   docusate sodium 100 MG capsule Commonly known as:  COLACE Take 1 capsule (100 mg total) by mouth 2 (two) times daily.   ibuprofen 600 MG tablet Commonly known as:  ADVIL,MOTRIN Take 1 tablet (600 mg total) by mouth every 6 (six) hours.   oxyCODONE 5 MG immediate release tablet Commonly known as:  Oxy IR/ROXICODONE Take 1 tablet (5 mg total) by mouth every 4 (four) hours as needed for moderate pain.      Follow-up Information    [facility] Schedule an appointment as soon as possible for a visit in 3 week(s).   Specialty:  Obstetrics and Gynecology Contact information: [address] [place] [id number] [phone]          Signed: Conan BowensKelly M Davis 09/04/2017, 11:45 AM

## 2017-09-04 NOTE — Progress Notes (Signed)
CSW received consult due to score 26 on Edinburgh Depression Screen.    When CSW arrived, MOB had 2 guest from [facility].  With MOB's permission, CSW asked the guest to leave in effort to meet with MOB in private.  MOB was tearful during most of the session, however she was receptive to the information provided by CSW.    CSW reviewed MOB's responses to the EDPS.  MOB shared that MOB was depressed prior to this pregnancy and since making an adoption plan MOB's mood has significantly decreased. MOB reported being tearful most days, social isolation, and lost of interest in activities she once enjoyed.  CSW assessed for safety and MOB denied SI, HI, and DV.  CSW was receptive to journal writing, meditating, and engaging in activities she once enjoyed. CSW also suggested outpatient counseling and medication management; MOB was receptive to both.  CSW provided resource information for [facility].  MOB agreed to visit agency with in the next 3-5 days. MOB was also receptive to CSW speaking with medical team regarding medication prior to MOB's discharge (CSW spoke with bedside and suggested medication for depression; bedside nurse will follow-up with CSW after speaking with OB provider).   CSW provided education regarding Baby Blues vs PMADs.  CSW encouraged MOB to evaluate her mental health throughout the postpartum period with the use of the New Mom Checklist developed by Postpartum Progress and notify a medical professional if symptoms arise.    There are no barriers to d/c.  Blaine HamperAngel Boyd-Gilyard, MSW, LCSW Clinical Social Work [phone]

## 2017-09-04 NOTE — Discharge Instructions (Signed)
Postpartum Care After Vaginal Delivery °The period of time right after you deliver your newborn is called the postpartum period. °What kind of medical care will I receive? °· You may continue to receive fluids and medicines through an IV tube inserted into one of your veins. °· If an incision was made near your vagina (episiotomy) or if you had some vaginal tearing during delivery, cold compresses may be placed on your episiotomy or your tear. This helps to reduce pain and swelling. °· You may be given a squirt bottle to use when you go to the bathroom. You may use this until you are comfortable wiping as usual. To use the squirt bottle, follow these steps: °? Before you urinate, fill the squirt bottle with warm water. Do not use hot water. °? After you urinate, while you are sitting on the toilet, use the squirt bottle to rinse the area around your urethra and vaginal opening. This rinses away any urine and blood. °? You may do this instead of wiping. As you start healing, you may use the squirt bottle before wiping yourself. Make sure to wipe gently. °? Fill the squirt bottle with clean water every time you use the bathroom. °· You will be given sanitary pads to wear. °How can I expect to feel? °· You may not feel the need to urinate for several hours after delivery. °· You will have some soreness and pain in your abdomen and vagina. °· If you are breastfeeding, you may have uterine contractions every time you breastfeed for up to several weeks postpartum. Uterine contractions help your uterus return to its normal size. °· It is normal to have vaginal bleeding (lochia) after delivery. The amount and appearance of lochia is often similar to a menstrual period in the first week after delivery. It will gradually decrease over the next few weeks to a dry, yellow-brown discharge. For most women, lochia stops completely by 6-8 weeks after delivery. Vaginal bleeding can vary from woman to woman. °· Within the first few  days after delivery, you may have breast engorgement. This is when your breasts feel heavy, full, and uncomfortable. Your breasts may also throb and feel hard, tightly stretched, warm, and tender. After this occurs, you may have milk leaking from your breasts. Your health care provider can help you relieve discomfort due to breast engorgement. Breast engorgement should go away within a few days. °· You may feel more sad or worried than normal due to hormonal changes after delivery. These feelings should not last more than a few days. If these feelings do not go away after several days, speak with your health care provider. °How should I care for myself? °· Tell your health care provider if you have pain or discomfort. °· Drink enough water to keep your urine clear or pale yellow. °· Wash your hands thoroughly with soap and water for at least 20 seconds after changing your sanitary pads, after using the toilet, and before holding or feeding your baby. °· If you are not breastfeeding, avoid touching your breasts a lot. Doing this can make your breasts produce more milk. °· If you become weak or lightheaded, or you feel like you might faint, ask for help before: °? Getting out of bed. °? Showering. °· Change your sanitary pads frequently. Watch for any changes in your flow, such as a sudden increase in volume, a change in color, the passing of large blood clots. If you pass a blood clot from your vagina, save it   to show to your health care provider. Do not flush blood clots down the toilet without having your health care provider look at them. °· Make sure that all your vaccinations are up to date. This can help protect you and your baby from getting certain diseases. You may need to have immunizations done before you leave the hospital. °· If desired, talk with your health care provider about methods of family planning or birth control (contraception). °How can I start bonding with my baby? °Spending as much time as  possible with your baby is very important. During this time, you and your baby can get to know each other and develop a bond. Having your baby stay with you in your room (rooming in) can give you time to get to know your baby. Rooming in can also help you become comfortable caring for your baby. Breastfeeding can also help you bond with your baby. °How can I plan for returning home with my baby? °· Make sure that you have a car seat installed in your vehicle. °? Your car seat should be checked by a certified car seat installer to make sure that it is installed safely. °? Make sure that your baby fits into the car seat safely. °· Ask your health care provider any questions you have about caring for yourself or your baby. Make sure that you are able to contact your health care provider with any questions after leaving the hospital. °This information is not intended to replace advice given to you by your health care provider. Make sure you discuss any questions you have with your health care provider. °Document Released: 05/12/2007 Document Revised: 12/18/2015 Document Reviewed: 06/19/2015 °Elsevier Interactive Patient Education © 2018 [employer] ° °

## 2017-09-04 NOTE — Progress Notes (Signed)
Discharge instructions given, questions answered, pt states understanding, signed and given copy 

## 2017-09-04 NOTE — Plan of Care (Signed)
Pt. Tearful at times. RN offering continuous emotional support. Pain well managed with PRNs. VSS. Will continue to monitor and support pt.

## 2017-09-10 ENCOUNTER — Institutional Professional Consult (permissible substitution): Payer: Self-pay

## 2017-09-10 NOTE — BH Specialist Note (Deleted)
Integrated Behavioral Health Follow Up Visit  MRN: [id number] Name: Summer Barnes  Number of Integrated Behavioral Health Clinician visits: {IBH Number of Visits:21014052} Session Start time: ***  Session End time: *** Total time: {IBH Total Time:21014050}  Type of Service: Integrated Behavioral Health- Individual/Family Interpretor:{yes NW:[id number]}no:[id number]} Interpretor Name and Language: ***  SUBJECTIVE: Summer Barnes is a 27 y.o. female accompanied by {Patient accompanied by:[phone]} Patient was referred by *** for ***. Patient reports the following symptoms/concerns: *** Duration of problem: ***; Severity of problem: {Mild/Moderate/Severe:20260}  OBJECTIVE: Mood: {BHH MOOD:22306} and Affect: {BHH AFFECT:22307} Risk of harm to self or others: {CHL AMB BH Suicide Current Mental Status:21022748}  LIFE CONTEXT: Family and Social: *** School/Work: *** Self-Care: *** Life Changes: ***  GOALS ADDRESSED: Patient will: 1.  Reduce symptoms of: {IBH Symptoms:21014056}  2.  Increase knowledge and/or ability of: {IBH Patient Tools:21014057}  3.  Demonstrate ability to: {IBH Goals:21014053}  INTERVENTIONS: Interventions utilized:  {IBH Interventions:21014054} Standardized Assessments completed: {IBH Screening Tools:21014051}  ASSESSMENT: Patient currently experiencing ***.   Patient may benefit from ***.  PLAN: 1. Follow up with behavioral health clinician on : *** 2. Behavioral recommendations: *** 3. Referral(s): {IBH Referrals:21014055} 4. "From scale of 1-10, how likely are you to follow plan?": ***  Rae LipsJamie C Linwood Gullikson, LCSW  Edinburgh Postnatal Depression Scale - 09/03/17 1500      Edinburgh Postnatal Depression Scale:  In the Past 7 Days   I have been able to laugh and see the funny side of things.  3    I have looked forward with enjoyment to things.  3    I have blamed myself unnecessarily when things went wrong.  3    I have been anxious or worried for no good reason.   3    I have felt scared or panicky for no good reason.  2    Things have been getting on top of me.  3    I have been so unhappy that I have had difficulty sleeping.  3    I have felt sad or miserable.  3    I have been so unhappy that I have been crying.  3    The thought of harming myself has occurred to me.  0    Edinburgh Postnatal Depression Scale Total  26         [facility] 08/11/2017 06/30/2017 06/13/2017  Decreased Interest 1 3 3   Down, Depressed, Hopeless 3 3 2   PHQ - 2 Score 4 6 5   Altered sleeping 1 3 3   Tired, decreased energy 3 1 3   Change in appetite 0 0 2  Feeling bad or failure about yourself  3 2 1   Trouble concentrating 0 2 0  Moving slowly or fidgety/restless 0 0 0  Suicidal thoughts 0 0 0  PHQ-9 Score 11 14 14    GAD 7 : Generalized Anxiety Score 08/11/2017 06/30/2017 06/13/2017  Nervous, Anxious, on Edge 3 1 1   Control/stop worrying 3 2 3   Worry too much - different things 3 2 3   Trouble relaxing 3 3 2   Restless 3 1 1   Easily annoyed or irritable 3 3 3   Afraid - awful might happen 0 1 0  Total GAD 7 Score 18 13 13

## 2017-09-15 ENCOUNTER — Ambulatory Visit: Payer: Self-pay | Admitting: Obstetrics and Gynecology

## 2017-09-28 ENCOUNTER — Encounter (HOSPITAL_COMMUNITY): Payer: Self-pay | Admitting: Emergency Medicine

## 2017-09-28 ENCOUNTER — Emergency Department (HOSPITAL_COMMUNITY)
Admission: EM | Admit: 2017-09-28 | Discharge: 2017-09-28 | Disposition: A | Discharge status: Home / Self Care | Payer: Medicaid Other | Attending: Emergency Medicine | Admitting: Emergency Medicine

## 2017-09-28 ENCOUNTER — Emergency Department (HOSPITAL_COMMUNITY): Payer: Medicaid Other

## 2017-09-28 DIAGNOSIS — S6992XA Unspecified injury of left wrist, hand and finger(s), initial encounter: Secondary | ICD-10-CM | POA: Insufficient documentation

## 2017-09-28 DIAGNOSIS — X58XXXA Exposure to other specified factors, initial encounter: Secondary | ICD-10-CM | POA: Insufficient documentation

## 2017-09-28 DIAGNOSIS — Z5321 Procedure and treatment not carried out due to patient leaving prior to being seen by health care provider: Secondary | ICD-10-CM | POA: Diagnosis not present

## 2017-09-28 DIAGNOSIS — Y939 Activity, unspecified: Secondary | ICD-10-CM | POA: Insufficient documentation

## 2017-09-28 DIAGNOSIS — Y999 Unspecified external cause status: Secondary | ICD-10-CM | POA: Diagnosis not present

## 2017-09-28 DIAGNOSIS — Y929 Unspecified place or not applicable: Secondary | ICD-10-CM | POA: Insufficient documentation

## 2017-09-28 NOTE — ED Triage Notes (Signed)
Pt presents to ED for assessment of injury to her left index finger, patient states she got it caught and it got tugged.  Some deformity noted, reduced movement of finger, increased pain with palpation

## 2017-09-28 NOTE — ED Triage Notes (Signed)
PT absent from room . Unable to located PT in front lobby.

## 2017-09-28 NOTE — ED Provider Notes (Cosign Needed)
Went to exam room to examine the patient and discuss x-ray results and the patient was not there. Staff attempted to locate the patient without success.   BP (!) 132/93   Pulse (!) 110   Temp 98.4 F (36.9 C) (Oral)   Resp 18   LMP 10/29/2016   SpO2 100%   Dg Finger Index Left  Result Date: 09/28/2017 CLINICAL DATA:  Smashed fingers moving boxes. EXAM: LEFT INDEX FINGER 2+V COMPARISON:  None. FINDINGS: No fracture or dislocation. Joint spaces are preserved. No erosions. Regional soft tissues appear normal. No radiopaque foreign body. IMPRESSION: No fracture or radiopaque foreign body. Electronically Signed   By: Simonne ComeJohn  Watts M.D.   On: 09/28/2017 16:59      Janne Napoleoneese, Melika Reder M, NP 09/28/17 1743

## 2017-09-30 ENCOUNTER — Ambulatory Visit (INDEPENDENT_AMBULATORY_CARE_PROVIDER_SITE_OTHER): Payer: Medicaid Other | Admitting: Obstetrics and Gynecology

## 2017-09-30 ENCOUNTER — Encounter: Payer: Self-pay | Admitting: Obstetrics and Gynecology

## 2017-09-30 VITALS — BP 131/84 | HR 70 | Ht 64.0 in | Wt 173.2 lb

## 2017-09-30 DIAGNOSIS — Z1389 Encounter for screening for other disorder: Secondary | ICD-10-CM | POA: Diagnosis not present

## 2017-09-30 DIAGNOSIS — Z3049 Encounter for surveillance of other contraceptives: Secondary | ICD-10-CM

## 2017-09-30 DIAGNOSIS — Z30430 Encounter for insertion of intrauterine contraceptive device: Secondary | ICD-10-CM

## 2017-09-30 LAB — POCT PREGNANCY, URINE: Preg Test, Ur: NEGATIVE

## 2017-09-30 MED ORDER — LEVONORGESTREL 19.5 MCG/DAY IU IUD
INTRAUTERINE_SYSTEM | Freq: Once | INTRAUTERINE | Status: AC
  Administered 2017-09-30: 12:00:00 via INTRAUTERINE

## 2017-09-30 NOTE — Progress Notes (Signed)
Subjective:     Summer FilbertSomarlia Barnes is a 27 y.o. female who presents for a postpartum visit. She is 4 weeks postpartum following a spontaneous vaginal delivery. I have fully reviewed the prenatal and intrapartum course. The delivery was at 40 gestational weeks. Outcome: spontaneous vaginal delivery. Baby was placed up for adoption.  Bowel function is normal. Bladder function is normal. Patient is sexually active. Contraception method is IUD. Postpartum depression screening: negative.  The following portions of the patient's history were reviewed and updated as appropriate: allergies, current medications, past family history, past medical history, past social history, past surgical history and problem list.  Review of Systems Pertinent items are noted in HPI.   Objective:    BP 131/84   Pulse 70   Ht 5\' 4"  (1.626 m)   Wt 173 lb 3.2 oz (78.6 kg)   LMP 10/29/2016   BMI 29.73 kg/m     General:  alert and cooperative   Breasts:  negative  Lungs: clear to auscultation bilaterally  Heart:  regular rate and rhythm, S1, S2 normal, no murmur, click, rub or gallop  Abdomen: soft, non-tender; bowel sounds normal; no masses,  no organomegaly   Vulva:  normal  Vagina: normal vagina  Cervix:  multiparous appearance  Corpus: not examined  Adnexa:  not evaluated  Rectal Exam: Not performed.        Assessment:   Normal postpartum exam. Pap smear not done at today's visit. Last PAP was in 2017 and it was negative.   Plan:   1. Contraception: IUD 2. Doing well.  3. Follow up in: 4 weeks for a string check   Khoi Hamberger, Harolyn RutherfordJennifer I, NP 10/01/2017 3:10 PM     GYNECOLOGY CLINIC PROCEDURE NOTE  Summer FilbertSomarlia Barnes  is a 27 year old female, here for [place] IUD insertion. No GYN concerns.  Last pap smear was in 2017 and it was normal.  IUD Insertion Procedure Note Patient identified, informed consent performed, consent signed.   Discussed risks of irregular bleeding, cramping, infection, malpositioning  or misplacement of the IUD outside the uterus which may require further procedure such as laparoscopy. Time out was performed.  Urine pregnancy test negative.  Speculum placed in the vagina.  Cervix visualized.  Cleaned with Betadine x 2.  Grasped anteriorly with a single tooth tenaculum.  Uterus sounded to 7 cm.  Liletta IUD placed per manufacturer's recommendations.  Strings trimmed to 3 cm. Tenaculum was removed, good hemostasis noted.  Patient tolerated procedure well.  Ibuprofen 800 mg PO given. Small amount of bleeding following insertion.   Patient was given post-procedure instructions.  She was advised to have backup contraception for one week.  Patient was also asked to check IUD strings periodically and follow up in 4 weeks for IUD check.

## 2017-10-03 ENCOUNTER — Encounter: Payer: Self-pay | Admitting: *Deleted

## 2017-10-21 ENCOUNTER — Telehealth: Payer: Self-pay | Admitting: *Deleted

## 2017-10-21 NOTE — Telephone Encounter (Signed)
Pt has question about her IUD. Please return call.

## 2017-10-21 NOTE — Telephone Encounter (Signed)
Returned patient's call. There wa no answer. Message left staing that if she still has questions please return my call at the clinic.

## 2017-10-28 NOTE — Telephone Encounter (Signed)
Patient called and left message on voicemail line stating she is returning our phone call. Called patient, no answer- left message on voicemail stating we are trying to reach you to return your phone call to see if you still need assistance or have questions. If so, please call us back

## 2018-01-30 ENCOUNTER — Ambulatory Visit: Payer: Self-pay | Admitting: Obstetrics and Gynecology

## 2018-04-13 ENCOUNTER — Encounter: Payer: Self-pay | Admitting: Advanced Practice Midwife

## 2018-04-13 ENCOUNTER — Ambulatory Visit: Payer: Self-pay | Admitting: Advanced Practice Midwife

## 2018-05-13 ENCOUNTER — Encounter: Payer: Self-pay | Admitting: Advanced Practice Midwife

## 2018-05-13 ENCOUNTER — Ambulatory Visit (INDEPENDENT_AMBULATORY_CARE_PROVIDER_SITE_OTHER): Payer: Medicaid Other | Admitting: Advanced Practice Midwife

## 2018-05-13 VITALS — BP 146/79 | HR 100 | Wt 189.6 lb

## 2018-05-13 DIAGNOSIS — Z30431 Encounter for routine checking of intrauterine contraceptive device: Secondary | ICD-10-CM

## 2018-05-13 DIAGNOSIS — N750 Cyst of Bartholin's gland: Secondary | ICD-10-CM | POA: Diagnosis not present

## 2018-05-13 DIAGNOSIS — Z975 Presence of (intrauterine) contraceptive device: Secondary | ICD-10-CM | POA: Diagnosis not present

## 2018-05-13 MED ORDER — IBUPROFEN 600 MG PO TABS: 600.0000 mg | ORAL_TABLET | Freq: Four times a day (QID) | ORAL | 1 refills | Status: AC | PRN

## 2018-05-13 NOTE — Progress Notes (Signed)
GYNECOLOGY CLINIC ANNUAL PREVENTATIVE CARE ENCOUNTER NOTE  Subjective:   Summer Barnes is a 27 y.o. [phone] female here for trimming of IUD strings.  Current complaints: Strings too long, and recurrence of Left Bartholins cyst.   Denies abnormal vaginal bleeding, discharge, pelvic pain, problems with intercourse or other gynecologic concerns.   Has continued to have heavy periods with cramping since insertion of IUD in March 2019.  Wants strings trimmed.  Also c/o recurrence of swelling with left Bartholins cyst, not painful at all.   Has had it drained three times, also drains spontaneously sometimes Would be interested in Marsupialization   Gynecologic History No LMP recorded. (Menstrual status: IUD). Contraception: IUD Last Pap: 07/15/16. Results were: normal  Obstetric History OB History  Gravida Para Term Preterm AB Living  4 3 3   1 3   SAB TAB Ectopic Multiple Live Births  1     0 3    # Outcome Date GA Lbr Len/2nd Weight Sex Delivery Anes PTL Lv  4 Term 09/03/17 [redacted]w[redacted]d 02:24 3395 g M Vag-Spont EPI  LIV     Birth Comments: WNL Baby placed for adoption     Complications: Pregnancy with adoption planned  3 Term 09/16/16 [redacted]w[redacted]d 06:05 / 00:02 2228 g F Vag-Spont None  LIV  2 Term 05/16/15   3629 g M Vag-Spont None N LIV  1 SAB 2014            History reviewed. No pertinent past medical history.  Past Surgical History:  Procedure Laterality Date  . NO PAST SURGERIES      No current outpatient medications on file prior to visit.   No current facility-administered medications on file prior to visit.     No Known Allergies  Social History   Socioeconomic History  . Marital status: Single    Spouse name: Not on file  . Number of children: Not on file  . Years of education: Not on file  . Highest education level: Not on file  Occupational History  . Not on file  Social Needs  . Financial resource strain: Not on file  . Food insecurity:    Worry: Not on file   Inability: Not on file  . Transportation needs:    Medical: Not on file    Non-medical: Not on file  Tobacco Use  . Smoking status: Former Smoker    Last attempt to quit: 04/10/2017    Years since quitting: 1.0  . Smokeless tobacco: Never Used  . Tobacco comment: 1 cigarette/day  Substance and Sexual Activity  . Alcohol use: No    Frequency: Never  . Drug use: Yes    Types: Marijuana  . Sexual activity: Yes    Birth control/protection: None  Lifestyle  . Physical activity:    Days per week: Not on file    Minutes per session: Not on file  . Stress: Not on file  Relationships  . Social connections:    Talks on phone: Not on file    Gets together: Not on file    Attends religious service: Not on file    Active member of club or organization: Not on file    Attends meetings of clubs or organizations: Not on file    Relationship status: Not on file  . Intimate partner violence:    Fear of current or ex partner: Not on file    Emotionally abused: Not on file    Physically abused: Not on file  Forced sexual activity: Not on file  Other Topics Concern  . Not on file  Social History Narrative  . Not on file    Family History  Problem Relation Age of Onset  . Arthritis Maternal Grandmother   . Anemia Maternal Grandmother     The following portions of the patient's history were reviewed and updated as appropriate: allergies, current medications, past family history, past medical history, past social history, past surgical history and problem list.  Review of Systems A comprehensive review of systems was negative except for: Genitourinary: positive for Swelling left Bartholins, IUD string too long, heavy and crampy periods with IUD   Objective:  BP (!) 146/79   Pulse 100   Wt 86 kg   BMI 32.54 kg/m  CONSTITUTIONAL: Well-developed, well-nourished female in no acute distress.  NECK: Normal range of motion SKIN: Skin is warm and dry. No rash noted. Not diaphoretic. No  erythema. No pallor. NEUROLGIC: Alert and oriented to person, place, and time. Normal reflexes, muscle tone coordination.  PSYCHIATRIC: Normal mood and affect. Normal behavior. Normal judgment and thought content. CARDIOVASCULAR: Normal heart rate noted RESPIRATORY: Effort normal, no problems with respiration noted. ABDOMEN: Soft, normal bowel sounds, no distention noted.  No tenderness, rebound or guarding.  MUSCULOSKELETAL: Normal range of motion.   PELVIC: Normal appearing external genitalia, except for 3-4cm fluctuant Left Bartholins cyst, nontender, not draining.  Other genitalia normal appearing vaginal mucosa and cervix.  No abnormal discharge noted.    Cervix long and closed.  Long IUD strings noted x 2, trimmed to 1cm     Assessment:  IUD Strings trimmed Dysmenorrhea and heavy bleeding with periods since IUD placed Left Bartholins cyst, enlarged, not inflamed, nontender   Plan:  Strings trimmed Rx Ibuprofen for dysmenorrhea, likely will help bleeding too Message sent to checkout to schedule appt with MD to discuss/plan Marsupialization.  Dr Earlene Plater consulted, would not drain cyst now if it is not tender Routine preventative health maintenance measures emphasized. Please refer to After Visit Summary for other counseling recommendations.    Wynelle Bourgeois CNM, MSN Certified Nurse Midwife, [facility] and [facility]

## 2018-05-13 NOTE — Patient Instructions (Signed)
Levonorgestrel intrauterine device (IUD) What is this medicine? LEVONORGESTREL IUD (LEE voe nor jes trel) is a contraceptive (birth control) device. The device is placed inside the uterus by a healthcare professional. It is used to prevent pregnancy. This device can also be used to treat heavy bleeding that occurs during your period. This medicine may be used for other purposes; ask your health care provider or pharmacist if you have questions. COMMON BRAND NAME(S): Kyleena, LILETTA, Mirena, Skyla What should I tell my health care provider before I take this medicine? They need to know if you have any of these conditions: -abnormal Pap smear -cancer of the breast, uterus, or cervix -diabetes -endometritis -genital or pelvic infection now or in the past -have more than one sexual partner or your partner has more than one partner -heart disease -history of an ectopic or tubal pregnancy -immune system problems -IUD in place -liver disease or tumor -problems with blood clots or take blood-thinners -seizures -use intravenous drugs -uterus of unusual shape -vaginal bleeding that has not been explained -an unusual or allergic reaction to levonorgestrel, other hormones, silicone, or polyethylene, medicines, foods, dyes, or preservatives -pregnant or trying to get pregnant -breast-feeding How should I use this medicine? This device is placed inside the uterus by a health care professional. Talk to your pediatrician regarding the use of this medicine in children. Special care may be needed. Overdosage: If you think you have taken too much of this medicine contact a poison control center or emergency room at once. NOTE: This medicine is only for you. Do not share this medicine with others. What if I miss a dose? This does not apply. Depending on the brand of device you have inserted, the device will need to be replaced every 3 to 5 years if you wish to continue using this type of birth  control. What may interact with this medicine? Do not take this medicine with any of the following medications: -amprenavir -bosentan -fosamprenavir This medicine may also interact with the following medications: -aprepitant -armodafinil -barbiturate medicines for inducing sleep or treating seizures -bexarotene -boceprevir -griseofulvin -medicines to treat seizures like carbamazepine, ethotoin, felbamate, oxcarbazepine, phenytoin, topiramate -modafinil -pioglitazone -rifabutin -rifampin -rifapentine -some medicines to treat HIV infection like atazanavir, efavirenz, indinavir, lopinavir, nelfinavir, tipranavir, ritonavir -St. John's wort -warfarin This list may not describe all possible interactions. Give your health care provider a list of all the medicines, herbs, non-prescription drugs, or dietary supplements you use. Also tell them if you smoke, drink alcohol, or use illegal drugs. Some items may interact with your medicine. What should I watch for while using this medicine? Visit your doctor or health care professional for regular check ups. See your doctor if you or your partner has sexual contact with others, becomes HIV positive, or gets a sexual transmitted disease. This product does not protect you against HIV infection (AIDS) or other sexually transmitted diseases. You can check the placement of the IUD yourself by reaching up to the top of your vagina with clean fingers to feel the threads. Do not pull on the threads. It is a good habit to check placement after each menstrual period. Call your doctor right away if you feel more of the IUD than just the threads or if you cannot feel the threads at all. The IUD may come out by itself. You may become pregnant if the device comes out. If you notice that the IUD has come out use a backup birth control method like condoms and call your   health care provider. Using tampons will not change the position of the IUD and are okay to use  during your period. This IUD can be safely scanned with magnetic resonance imaging (MRI) only under specific conditions. Before you have an MRI, tell your healthcare provider that you have an IUD in place, and which type of IUD you have in place. What side effects may I notice from receiving this medicine? Side effects that you should report to your doctor or health care professional as soon as possible: -allergic reactions like skin rash, itching or hives, swelling of the face, lips, or tongue -fever, flu-like symptoms -genital sores -high blood pressure -no menstrual period for 6 weeks during use -pain, swelling, warmth in the leg -pelvic pain or tenderness -severe or sudden headache -signs of pregnancy -stomach cramping -sudden shortness of breath -trouble with balance, talking, or walking -unusual vaginal bleeding, discharge -yellowing of the eyes or skin Side effects that usually do not require medical attention (report to your doctor or health care professional if they continue or are bothersome): -acne -breast pain -change in sex drive or performance -changes in weight -cramping, dizziness, or faintness while the device is being inserted -headache -irregular menstrual bleeding within first 3 to 6 months of use -nausea This list may not describe all possible side effects. Call your doctor for medical advice about side effects. You may report side effects to FDA at 1-800-FDA-1088. Where should I keep my medicine? This does not apply. NOTE: This sheet is a summary. It may not cover all possible information. If you have questions about this medicine, talk to your doctor, pharmacist, or health care provider.  2018 Elsevier/Gold Standard (2016-04-26 14:14:56) Bartholin Cyst or Abscess A Bartholin cyst is a fluid-filled sac that forms on a Bartholin gland. Bartholin glands are small glands that are located within the folds of skin (labia) along the sides of the lower opening of the  vagina. These glands produce a fluid to moisten the outside of the vagina during sexual intercourse. A Bartholin cyst causes a bulge on the side of the vagina. A cyst that is not large or infected may not cause symptoms or problems. However, if the fluid within the cyst becomes infected, the cyst can turn into an abscess. An abscess may cause discomfort or pain. What are the causes? A Bartholin cyst may develop when the duct of the gland becomes blocked. In many cases, the cause of this is not known. Various kinds of bacteria can cause the cyst to become infected and develop into an abscess. What increases the risk? You may be at an increased risk of developing a Bartholin cyst or abscess if:  You are a woman of reproductive age.  You have a history of previous Bartholin cysts or abscesses.  You have diabetes.  You have a sexually transmitted disease (STD).  What are the signs or symptoms? The severity of symptoms varies depending on the size of the cyst and whether it is infected. Symptoms may include:  A bulge or swelling near the lower opening of your vagina.  Discomfort or pain.  Redness.  Pain during sexual intercourse.  Pain when walking.  Fluid draining from the area.  How is this diagnosed? Your health care provider may make a diagnosis based on your symptoms and a physical exam. He or she will look for swelling in your vaginal area. Blood tests may be done to check for infections. A sample of fluid from the cyst or abscess may also  be taken to be tested in a lab. How is this treated? Small cysts that are not infected may not require any treatment. These often go away on their own. Yourhealth care provider will recommend hot baths and the use of warm compresses. These may also be part of the treatment for an abscess. Treatment options for a large cyst or abscess may include:  Antibiotic medicine.  A surgical procedure to drain the abscess. One of the following procedures  may be done: ? Incision and drainage. An incision is made in the cyst or abscess so that the fluid drains out. A catheter may be placed inside the cyst so that it does not close and fill up with fluid again. The catheter will be removed after you have a follow-up visit with a specialist (gynecologist). ? Marsupialization. The cyst or abscess is opened and kept open by stitching the edges of the skin to the walls of the cyst or abscess. This allows it to continue to drain and not fill up with fluid again.  If you have cysts or abscesses that keep returning and have required incision and drainage multiple times, your health care provider may talk to you about surgery to remove the Bartholin gland. Follow these instructions at home:  Take medicines only as directed by your health care provider.  If you were prescribed an antibiotic medicine, finish it all even if you start to feel better.  Apply warm, wet compresses to the area or take warm, shallow baths that cover your pelvic region (sitz baths) several times a day or as directed by your health care provider.  Do not squeeze the cyst or apply heavy pressure to it.  Do not have sexual intercourse until the cyst has gone away.  If your cyst or abscess was opened, a small piece of gauze or a drain may have been placed in the area to allow drainage. Do not remove the gauze or the drain until directed by your health care provider.  Wear feminine pads-not tampons-as needed for any drainage or bleeding.  Keep all follow-up visits as directed by your health care provider. This is important. How is this prevented? Take these steps to help prevent a Bartholin cyst from returning:  Practice good hygiene.  Clean your vaginal area with mild soap and a soft cloth when you bathe.  Practice safe sex to prevent STDs.  Contact a health care provider if:  You have increased pain, swelling, or redness in the area of the cyst.  Puslike drainage is  coming from the cyst.  You have a fever. This information is not intended to replace advice given to you by your health care provider. Make sure you discuss any questions you have with your health care provider. Document Released: 07/15/2005 Document Revised: 12/21/2015 Document Reviewed: 02/28/2014 Elsevier Interactive Patient Education  2018 [employer].

## 2018-06-08 ENCOUNTER — Ambulatory Visit: Payer: Self-pay | Admitting: Family Medicine

## 2018-06-10 ENCOUNTER — Ambulatory Visit: Payer: Self-pay | Admitting: Obstetrics & Gynecology

## 2018-06-10 NOTE — Progress Notes (Deleted)
   Patient did not show up today for her scheduled appointment.   Treazure Nery, MD, FACOG Obstetrician & Gynecologist, [facility], [facility]  

## 2018-07-01 ENCOUNTER — Encounter: Payer: Self-pay | Admitting: Family Medicine

## 2018-07-01 ENCOUNTER — Ambulatory Visit (INDEPENDENT_AMBULATORY_CARE_PROVIDER_SITE_OTHER): Payer: Medicaid Other | Admitting: Family Medicine

## 2018-07-01 VITALS — BP 135/82 | HR 78 | Wt 188.0 lb

## 2018-07-01 DIAGNOSIS — N750 Cyst of Bartholin's gland: Secondary | ICD-10-CM

## 2018-07-01 NOTE — Progress Notes (Signed)
   Subjective:    Summer Barnes - 27 y.o. female MRN [id number]  Date of birth: 1991/04/25  HPI  Summer Barnes is a 27 y.o. [phone] female here for Bartholin's cyst States that she's had it "a while"; maybe since 2013 Has been drained several times but keeps coming back Currently denies pain, redness or drainage of any kind Would like it drained because it's inconvenient to have it there   OB History    Gravida  4   Para  3   Term  3   Preterm      AB  1   Living  3     SAB  1   TAB      Ectopic      Multiple  0   Live Births  3             Health Maintenance:   Health Maintenance Due  Topic Date Due  . INFLUENZA VACCINE  02/26/2018    -  reports that she quit smoking about 14 months ago. She has never used smokeless tobacco. - Review of Systems: Per HPI. - Past Medical History: Patient Active Problem List   Diagnosis Date Noted  . IUD (intrauterine device) in place 05/13/2018  . Cyst of left Bartholin's gland duct 05/13/2018  . Insufficient prenatal care 09/03/2017  . Pregnancy with adoption planned 09/03/2017  . Uterine contractions during pregnancy 09/03/2017  . Vaginal delivery 09/03/2017  . Fetal anomaly suspected but not found 08/11/2017  . History of prior pregnancy with SGA newborn 06/30/2017  . Supervision of high risk pregnancy, antepartum 06/12/2017   - Medications: reviewed and updated   Objective:   Physical Exam BP 135/82   Pulse 78   Wt 188 lb (85.3 kg)   BMI 32.27 kg/m  Gen: NAD, alert, cooperative with exam, well-appearing HEENT: NCAT, PER, clear conjunctiva Resp:  non-labored Skin: no rashes, normal turgor  Neuro: no gross deficits.  Psych: good insight, alert and oriented GU/GYN: Exam performed in the presence of a chaperone. ~3x3cm cystic mass in L labia minora near introitus. The area was prepped and anesthestized with lidocaine. It was then incised with an 11 blade scalpel and thick dark brown colored fluid  expressed. Word catheter was then inserted and inflated with sterile saline.  The patient tolerated the procedure well.      Assessment & Plan:   Cyst of left Bartholin's gland duct - drained today and Word catheter inserted - follow up in 6 weeks for removal   Routine preventative health maintenance measures emphasized. Please refer to After Visit Summary for other counseling recommendations.   Return in about 6 weeks (around 08/12/2018) for Review Bartholin's cyst catheter .  Gwenevere AbbotNimeka Raef Sprigg, MD OB Fellow  07/01/2018, 5:22 PM

## 2019-10-13 IMAGING — DX DG FINGER INDEX 2+V*L*
3 series · 3 of 3 positions shown · non-contrast
Comparison: None.

CLINICAL DATA: Smashed fingers moving boxes.

EXAM:
LEFT INDEX FINGER 2+V

[x finger pa left]
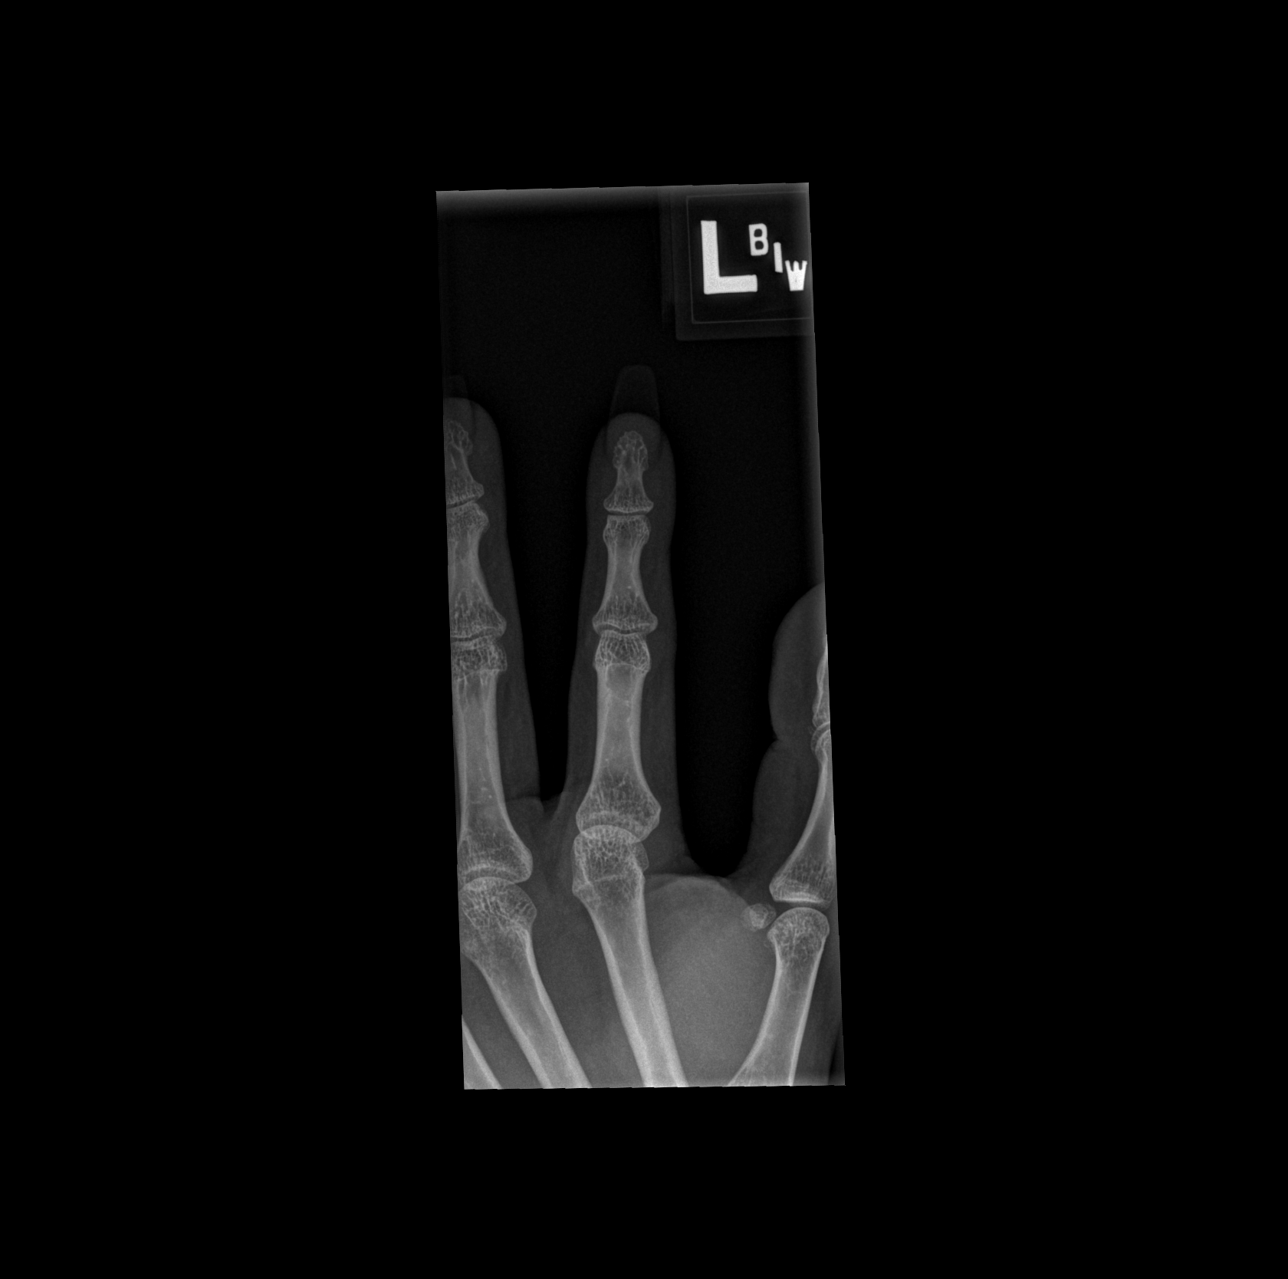

[x finger obl left]
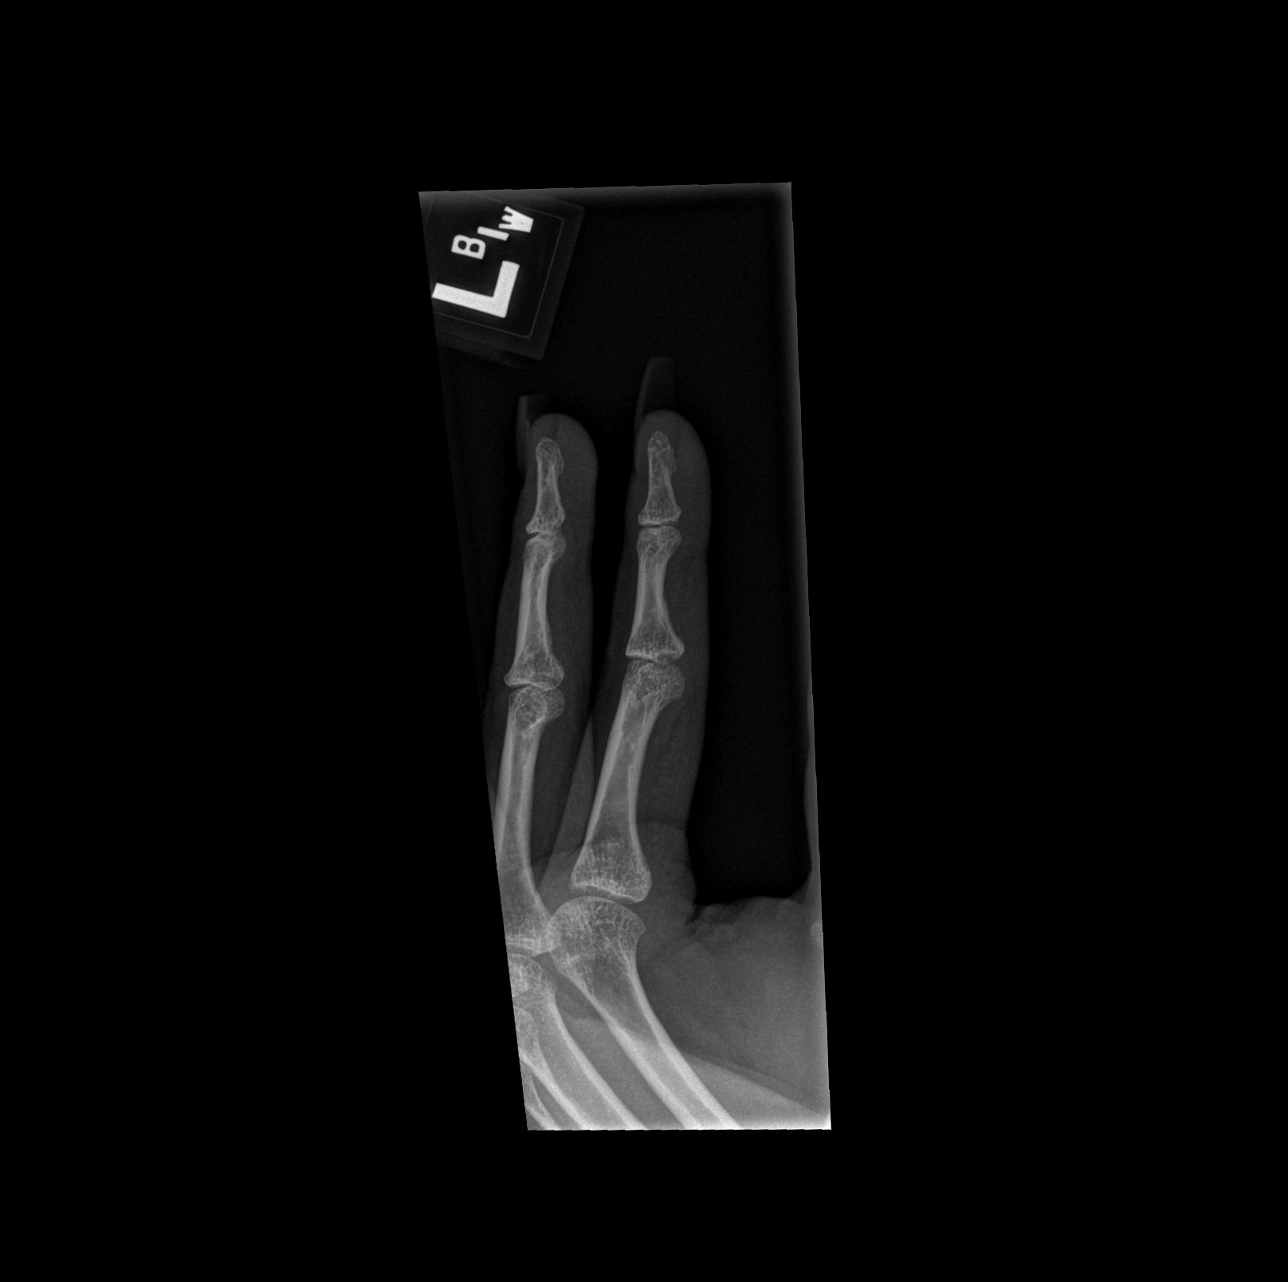

[x finger lat left]
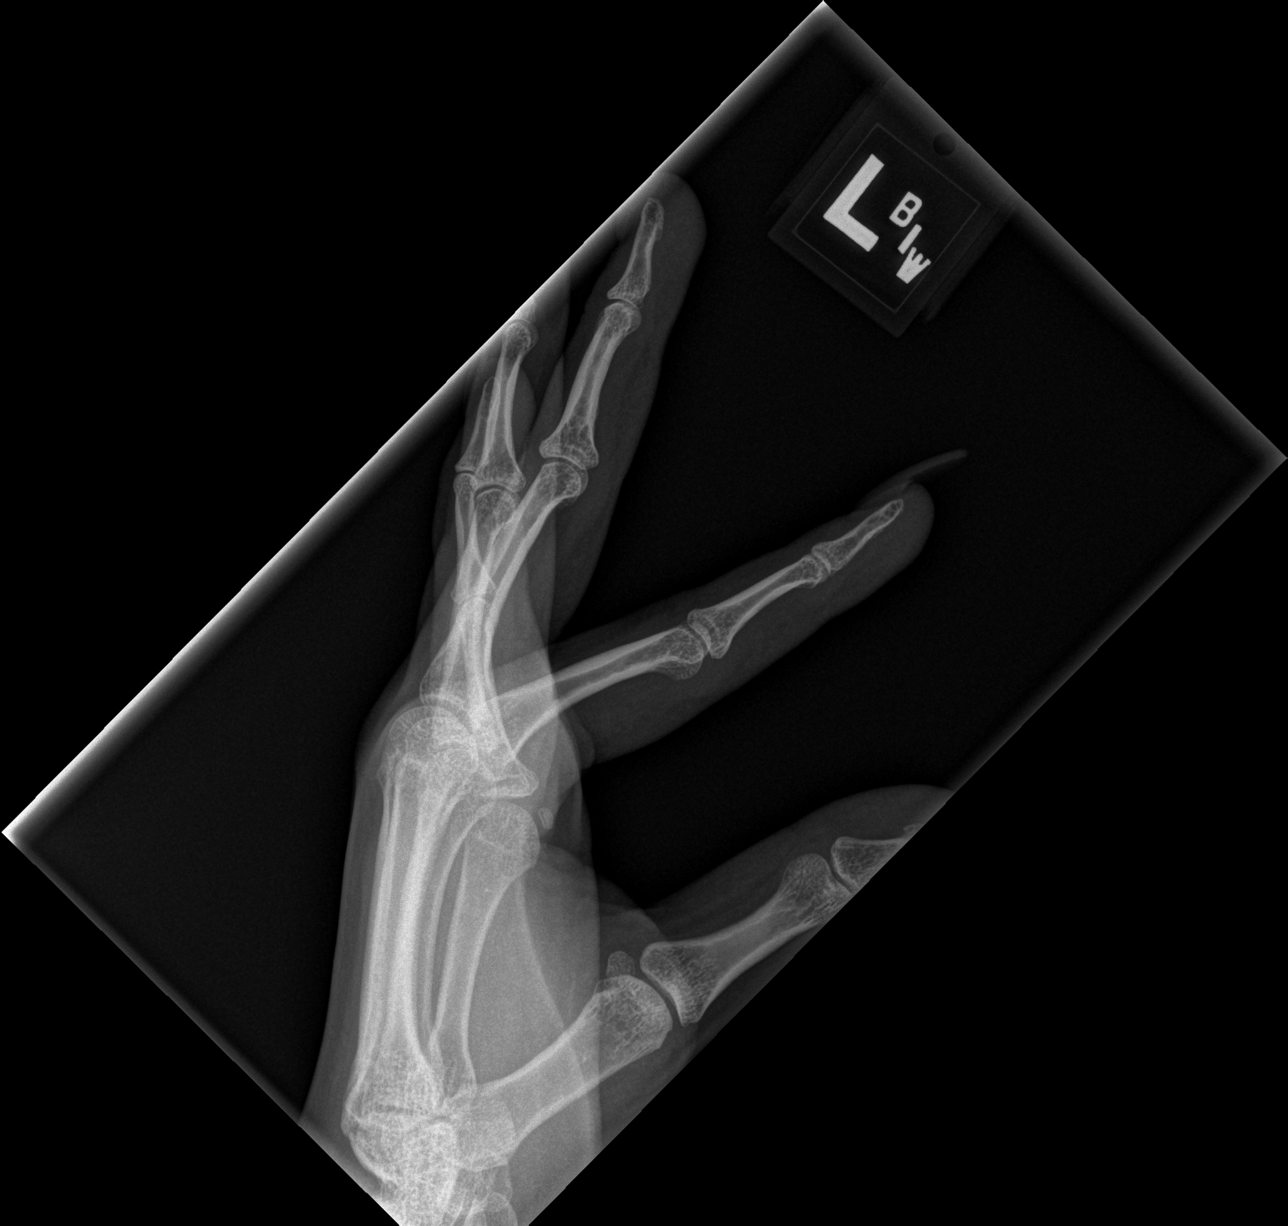

[3 of 3 positions shown; findings below may reference images not displayed]

FINDINGS: No fracture or dislocation. Joint spaces are preserved. No erosions.
Regional soft tissues appear normal. No radiopaque foreign body.
IMPRESSION: No fracture or radiopaque foreign body.

## 2024-04-10 ENCOUNTER — Emergency Department (HOSPITAL_COMMUNITY)
Admission: EM | Admit: 2024-04-10 | Discharge: 2024-04-10 | Disposition: A | Discharge status: Home / Self Care | Source: Home / Self Care
